# Patient Record
Sex: Male | Born: 1960 | Race: Black or African American | Hispanic: No | Marital: Married | Smoking: Former smoker
Health system: Southern US, Community
[De-identification: ages and names within clinical notes are randomized; demographics above are authoritative.]

## PROBLEM LIST (undated history)

## (undated) DIAGNOSIS — K317 Polyp of stomach and duodenum: Secondary | ICD-10-CM

## (undated) DIAGNOSIS — I10 Essential (primary) hypertension: Secondary | ICD-10-CM

## (undated) HISTORY — DX: Essential (primary) hypertension: I10

## (undated) HISTORY — DX: Polyp of stomach and duodenum: K31.7

---

## 2011-02-14 ENCOUNTER — Ambulatory Visit: Payer: Self-pay | Admitting: Family Medicine

## 2011-02-21 ENCOUNTER — Encounter: Payer: Self-pay | Admitting: Family Medicine

## 2011-02-21 ENCOUNTER — Ambulatory Visit (INDEPENDENT_AMBULATORY_CARE_PROVIDER_SITE_OTHER): Payer: 59 | Admitting: Family Medicine

## 2011-02-21 DIAGNOSIS — Z125 Encounter for screening for malignant neoplasm of prostate: Secondary | ICD-10-CM

## 2011-02-21 DIAGNOSIS — I10 Essential (primary) hypertension: Secondary | ICD-10-CM | POA: Insufficient documentation

## 2011-02-21 DIAGNOSIS — Z13220 Encounter for screening for lipoid disorders: Secondary | ICD-10-CM

## 2011-02-21 MED ORDER — TRIAMTERENE-HCTZ 37.5-25 MG PO TABS: 1.0000 | ORAL_TABLET | Freq: Every day | ORAL | Status: DC

## 2011-02-21 NOTE — Progress Notes (Signed)
  Subjective:    Patient ID: Joel Johnston, male    DOB: 1960-12-31, 50 y.o.   MRN: [id number]  HPI 50 yo AAM presents for NOV.  He is previously healthy.  He has some seasonal allergies. He has not been to the doctor or had labs in years.  Denies hx of heart problems or a fam hx of HTN.  Denies change in vision, edema, HAs, or use of decongestants.  Denies CP or DOE.     BP 174/112  Pulse 81  Temp(Src) 98.7 F (37.1 C) (Oral)  Ht 5' 7.25" (1.708 m)  Wt 181 lb (82.101 kg)  BMI 28.14 kg/m2  SpO2 95%  Past Medical History  Diagnosis Date  . Hypertension     History reviewed. No pertinent past surgical history.  Family History  Problem Relation Age of Onset  . Cancer Mother     History   Social History  . Marital Status: Married    Spouse Name: Okey Regal    Number of Children: 0  . 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 Smoking status: Former Smoker    Quit date: 10/28/1983  . Smokeless tobacco: Never Used  . Alcohol Use: 1.2 oz/week    2 Cans of beer per week  . Drug Use: No  . Sexually Active: Yes   Other Topics Concern  . Not on file   Social History Narrative  . No narrative on file    No Known Allergies  Current outpatient prescriptions:triamterene-hydrochlorothiazide (MAXZIDE-25) 37.5-25 MG per tablet, Take 1 tablet by mouth daily., Disp: 30 tablet, Rfl: 3   Review of Systems  Constitutional: Negative for fatigue.  Eyes: Negative for visual disturbance.  Respiratory: Negative for cough and shortness of breath.   Cardiovascular: Negative for chest pain, palpitations and leg swelling.  Genitourinary: Negative for difficulty urinating.  Musculoskeletal: Negative for back pain.  Neurological: Negative for headaches.  Psychiatric/Behavioral: Negative for dysphoric mood.       Objective:   Physical Exam  Constitutional: He appears well-developed and well-nourished. No  distress.  HENT:  Head: Normocephalic and atraumatic.  Eyes: Pupils are equal, round, and reactive to light.  Neck: No thyromegaly present.  Cardiovascular: Normal rate, regular rhythm and normal heart sounds.   No murmur heard. Pulmonary/Chest: Effort normal and breath sounds normal. No respiratory distress.  Musculoskeletal: He exhibits no edema.  Lymphadenopathy:    He has no cervical adenopathy.  Skin: Skin is warm and dry.  Psychiatric: He has a normal mood and affect.          Assessment & Plan:

## 2011-02-21 NOTE — Patient Instructions (Addendum)

## 2011-02-21 NOTE — Assessment & Plan Note (Signed)
BP very high today w/o use of cold meds.  Will go ahead and start him on Maxzide, esp given his DBP > 100.  Update fasting labs and set up CPE with EKG in 4 wks.  Call if any problems.  Work on low sodium diet.

## 2011-02-28 LAB — CBC
HCT: 52.7 % — ABNORMAL HIGH (ref 39.0–52.0)
Hemoglobin: 17.9 g/dL — ABNORMAL HIGH (ref 13.0–17.0)
MCH: 29.9 pg (ref 26.0–34.0)
MCHC: 34 g/dL (ref 30.0–36.0)
MCV: 88 fL (ref 78.0–100.0)
Platelets: 221 10*3/uL (ref 150–400)
RBC: 5.99 MIL/uL — ABNORMAL HIGH (ref 4.22–5.81)
RDW: 14.4 % (ref 11.5–15.5)
WBC: 6 10*3/uL (ref 4.0–10.5)

## 2011-02-28 LAB — LIPID PANEL
Cholesterol: 136 mg/dL (ref 0–200)
HDL: 40 mg/dL (ref >39)
LDL Cholesterol: 84 mg/dL (ref 0–99)
Total CHOL/HDL Ratio: 3.4 Ratio
Triglycerides: 60 mg/dL (ref <150)
VLDL: 12 mg/dL (ref 0–40)

## 2011-02-28 LAB — PSA: PSA: 1.05 ng/mL (ref <=4.00)

## 2011-03-01 ENCOUNTER — Telehealth: Payer: Self-pay | Admitting: Family Medicine

## 2011-03-01 LAB — COMPLETE METABOLIC PANEL WITH GFR
ALT: 21 U/L (ref 0–53)
AST: 20 U/L (ref 0–37)
Albumin: 4.5 g/dL (ref 3.5–5.2)
Alkaline Phosphatase: 94 U/L (ref 39–117)
BUN: 14 mg/dL (ref 6–23)
CO2: 26 mEq/L (ref 19–32)
Calcium: 9.5 mg/dL (ref 8.4–10.5)
Chloride: 103 mEq/L (ref 96–112)
Creat: 1.15 mg/dL (ref 0.40–1.50)
GFR, Est African American: >60 mL/min (ref >60)
GFR, Est Non African American: >60 mL/min (ref >60)
Glucose, Bld: 96 mg/dL (ref 70–99)
Potassium: 4.7 mEq/L (ref 3.5–5.3)
Sodium: 141 mEq/L (ref 135–145)
Total Bilirubin: 0.4 mg/dL (ref 0.3–1.2)
Total Protein: 6.7 g/dL (ref 6.0–8.3)

## 2011-03-01 NOTE — Telephone Encounter (Signed)
Pls let pt know that his blood counts came back normal.  Fasting sugar, liver and and kidney function came back normal.  Cholesterol is at goal and prostate cancer screen is normal.  Repeat in 1 yr.

## 2011-03-03 NOTE — Telephone Encounter (Signed)
Pt aware of the below

## 2011-04-04 ENCOUNTER — Encounter: Payer: Self-pay | Admitting: Family Medicine

## 2011-04-04 ENCOUNTER — Ambulatory Visit (INDEPENDENT_AMBULATORY_CARE_PROVIDER_SITE_OTHER): Payer: 59 | Admitting: Family Medicine

## 2011-04-04 VITALS — BP 144/95 | HR 90 | Ht 67.0 in | Wt 184.0 lb

## 2011-04-04 DIAGNOSIS — I10 Essential (primary) hypertension: Secondary | ICD-10-CM

## 2011-04-04 MED ORDER — TRIAMTERENE-HCTZ 37.5-25 MG PO TABS: 1.0000 | ORAL_TABLET | Freq: Every day | ORAL | Status: DC

## 2011-04-04 NOTE — Progress Notes (Signed)
  Subjective:    Patient ID: Joel Johnston, male    DOB: July 24, 1961, 50 y.o.   MRN: [id number]  HPI  50 yo AAF presents for f/u HTN visit.  He was started on Maxzide 6 wks ago but ran out 2 wks ago.  Denies chest pain or DOE.  He did not RF his RX.  He was doing great on it.  Had labs done last visit.    BP 144/95  Pulse 90  Ht 5\' 7"  (1.702 m)  Wt 184 lb (83.462 kg)  BMI 28.82 kg/m2  SpO2 98%    Review of Systems  Constitutional: Negative for fatigue.  Eyes: Negative for visual disturbance.  Respiratory: Negative for shortness of breath.   Cardiovascular: Negative for chest pain, palpitations and leg swelling.  Neurological: Negative for headaches.       Objective:   Physical Exam  Constitutional: He appears well-developed and well-nourished.  HENT:  Mouth/Throat: Oropharynx is clear and moist.  Neck: Neck supple. No thyromegaly present.  Cardiovascular: Normal rate, regular rhythm and normal heart sounds.   Pulmonary/Chest: Effort normal and breath sounds normal. No respiratory distress. He has no wheezes.  Musculoskeletal: He exhibits no edema.  Skin: Skin is warm and dry.  Psychiatric: He has a normal mood and affect.          Assessment & Plan:

## 2011-04-04 NOTE — Assessment & Plan Note (Signed)
BP high today because he ran out of Maxzide 2 wks ago.  Will RF his Maxzide and f/u in a month for a PHYSICAL with EKG and to recheck BMP next visit.

## 2011-04-04 NOTE — Patient Instructions (Signed)
Restart MAXZIDE everyday for high Blood pressure.  Refilled today.  Return for a PHYSICAL, repeat BMP/ EKG in 1 month.

## 2011-05-02 ENCOUNTER — Encounter: Payer: Self-pay | Admitting: Family Medicine

## 2011-05-02 ENCOUNTER — Ambulatory Visit (INDEPENDENT_AMBULATORY_CARE_PROVIDER_SITE_OTHER): Payer: Self-pay | Admitting: Family Medicine

## 2011-05-02 VITALS — BP 158/97 | HR 87 | Ht 67.0 in | Wt 185.0 lb

## 2011-05-02 DIAGNOSIS — Z23 Encounter for immunization: Secondary | ICD-10-CM

## 2011-05-02 DIAGNOSIS — Z0000 Encounter for general adult medical examination without abnormal findings: Secondary | ICD-10-CM

## 2011-05-02 DIAGNOSIS — Z1211 Encounter for screening for malignant neoplasm of colon: Secondary | ICD-10-CM

## 2011-05-02 DIAGNOSIS — I10 Essential (primary) hypertension: Secondary | ICD-10-CM

## 2011-05-02 MED ORDER — TETANUS-DIPHTH-ACELL PERTUSSIS 5-2.5-18.5 LF-MCG/0.5 IM SUSP: 0.5000 mL | Freq: Once | INTRAMUSCULAR | Status: DC

## 2011-05-02 MED ORDER — TELMISARTAN-HCTZ 80-12.5 MG PO TABS: 1.0000 | ORAL_TABLET | Freq: Every day | ORAL | Status: DC

## 2011-05-02 NOTE — Patient Instructions (Signed)
Change Maxzide to Micardis/ HCTZ once daily for high BP.  EKG looks good.  Repeat chemistry panel today. Will call you w/ results on Monday.  Return for f/u BP in 3 mos.

## 2011-05-02 NOTE — Progress Notes (Signed)
  Subjective:    Patient ID: Joel Johnston, male    DOB: [birth year], 50 y.o.   MRN: [id number]  HPI  50 yo AAM presents for CPE.  He is back on the Maxzide but his BP is still high.  He had labs done this year but is due to have BMP rechecked.  He no longer smokes.  Denies any chest pain or SOB.  No fam hx of heart dz.  He has never had a colonoscopy.  He is willing to schedule this.  Denies a fam hx of prostate or colon cancer.    BP 158/97  Pulse 87  Ht 5\' 7"  (1.702 m)  Wt 185 lb (83.915 kg)  BMI 28.97 kg/m2  SpO2 99%     Review of Systems Gen: no fevers, chills, hot flashes, night sweats, change in weight GI: no N/V/C/D GU: no dysuria, incontinence or sexual dysfunction CV: no chest pain, DOE, palpitations s or edema Pulm:  Denies CP, SOB or chronic cough     Objective:   Physical Exam  Genitourinary: Rectum normal and prostate normal. Guaiac negative stool.   Gen: alert, well groomed in NAD Neck: no thyromegaly or cervical lymphadenopathy CV: RRR w/o murmur, no audible carotid bruits or abdominal aortic bruits Ext: no edema, clubbing or cyanosis Lungs: CTA bilat w/o W/R/R; nonlabored HEENT:  [place]/AT; PERRLA; oropharynx pink and moist with good dentition Abd: soft, NT, ND, NABS, No HSM, no audible AA bruits Skin: warm and dry; no rash, pallor or jaundice Psych: does not appear anxious or depressed; answers questions appropriately        Assessment & Plan:  Assesment:  1. CPE- Keeping healthy checklist for men reviewed today.  BP still high on Maxzide.  Will change him to Micardis/ HCTZ 80/12.5 mg daily.  BMI 28  in the overwt range.     Labs UTD, asside from f/u BMP. Colonoscopy due -- will set up. PSA UTD, DRE done today. Encouraged healthy diet, regular exercise, MVI daily. Return for next physical in 1 yr.  Return for f/u BP in 2-3 mos. Tdap updated today. EKG: NSR at 76 bpm, PR 144 ms, QTc 402 ms, left axis deviation.  No sign of ischemia.

## 2011-05-03 LAB — BASIC METABOLIC PANEL WITH GFR
BUN: 11 mg/dL (ref 6–23)
CO2: 20 mEq/L (ref 19–32)
Calcium: 9.6 mg/dL (ref 8.4–10.5)
Chloride: 107 mEq/L (ref 96–112)
Creat: 0.99 mg/dL (ref 0.50–1.35)
GFR, Est African American: >60 mL/min (ref >60)
GFR, Est Non African American: >60 mL/min (ref >60)
Glucose, Bld: 105 mg/dL — ABNORMAL HIGH (ref 70–99)
Potassium: 4.2 mEq/L (ref 3.5–5.3)
Sodium: 141 mEq/L (ref 135–145)

## 2011-05-04 ENCOUNTER — Telehealth: Payer: Self-pay | Admitting: Family Medicine

## 2011-05-04 NOTE — Telephone Encounter (Signed)
pls let pt know that his chemistry panel came back normal.

## 2011-05-05 NOTE — Telephone Encounter (Signed)
Pt aware of the above  

## 2011-05-14 ENCOUNTER — Encounter: Payer: Self-pay | Admitting: Family Medicine

## 2011-08-01 ENCOUNTER — Ambulatory Visit: Payer: Self-pay | Admitting: Family Medicine

## 2014-02-24 ENCOUNTER — Encounter: Payer: Self-pay | Admitting: Physician Assistant

## 2014-02-24 ENCOUNTER — Ambulatory Visit (INDEPENDENT_AMBULATORY_CARE_PROVIDER_SITE_OTHER): Payer: BC Managed Care – PPO | Admitting: Physician Assistant

## 2014-02-24 VITALS — BP 177/103 | HR 82 | Ht 67.0 in | Wt 170.0 lb

## 2014-02-24 DIAGNOSIS — Z131 Encounter for screening for diabetes mellitus: Secondary | ICD-10-CM

## 2014-02-24 DIAGNOSIS — Z1211 Encounter for screening for malignant neoplasm of colon: Secondary | ICD-10-CM

## 2014-02-24 DIAGNOSIS — I10 Essential (primary) hypertension: Secondary | ICD-10-CM

## 2014-02-24 DIAGNOSIS — Z13220 Encounter for screening for lipoid disorders: Secondary | ICD-10-CM

## 2014-02-24 MED ORDER — TELMISARTAN-HCTZ 80-12.5 MG PO TABS: 1.0000 | ORAL_TABLET | Freq: Every day | ORAL | Status: DC

## 2014-02-24 NOTE — Patient Instructions (Signed)

## 2014-02-24 NOTE — Progress Notes (Signed)
   Subjective:    Patient ID: Joel Johnston, male    DOB: 1960/11/10, 53 y.o.   MRN: [id number]  HPI Pt is a 53 yo male who presents to the clinic to re-establish care. Pt has hx of HTN and has been off medication for over one year. Denies any HA, vision changes, CP or palpitations. Exercises daily. Pt went for job physical and they sent him to urgent care because BP was so high. He tolerated micardis/hCt in the past.   . Active Ambulatory Problems    Diagnosis Date Noted  . Essential hypertension, benign 02/21/2011   Resolved Ambulatory Problems    Diagnosis Date Noted  . No Resolved Ambulatory Problems   Past Medical History  Diagnosis Date  . Hypertension    . Family History  Problem Relation Age of Onset  . Cancer Mother    . History   Social History  . Marital Status: Married    Spouse Name: Okey RegalCarol    Number of Children: 0  . 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 Smoking status: Former Smoker    Quit date: 10/28/1983  . Smokeless tobacco: Never Used  . Alcohol Use: 1.2 oz/week    2 Cans of beer per week  . Drug Use: No  . Sexual Activity: Yes   Other Topics Concern  . Not on file   Social History Narrative  . No narrative on file     Review of Systems  All other systems reviewed and are negative.      Objective:   Physical Exam  Constitutional: He is oriented to person, place, and time. He appears well-developed and well-nourished.  HENT:  Head: Normocephalic and atraumatic.  Cardiovascular: Normal rate, regular rhythm and normal heart sounds.   Pulmonary/Chest: Effort normal and breath sounds normal.  Neurological: He is alert and oriented to person, place, and time.  Psychiatric: He has a normal mood and affect. His behavior is normal.          Assessment & Plan:  HTN- restarted micardis/HCT since worked in the past. Follow up in 4 weeks for BP recheck.  Reminded of low salt diet.   Needs lipid and kidney function checked. Gave labslip and reminded to be fasting.   Needs colonoscopy sent referral.   Reminded of CPE need.

## 2014-02-28 LAB — COMPLETE METABOLIC PANEL WITH GFR
ALT: 14 U/L (ref 0–53)
AST: 16 U/L (ref 0–37)
Albumin: 4.3 g/dL (ref 3.5–5.2)
Alkaline Phosphatase: 91 U/L (ref 39–117)
BUN: 13 mg/dL (ref 6–23)
CO2: 31 mEq/L (ref 19–32)
Calcium: 9.4 mg/dL (ref 8.4–10.5)
Chloride: 104 mEq/L (ref 96–112)
Creat: 0.92 mg/dL (ref 0.50–1.35)
GFR, Est African American: >89 mL/min
GFR, Est Non African American: >89 mL/min
Glucose, Bld: 88 mg/dL (ref 70–99)
Potassium: 3.9 mEq/L (ref 3.5–5.3)
Sodium: 140 mEq/L (ref 135–145)
Total Bilirubin: 0.9 mg/dL (ref 0.2–1.2)
Total Protein: 6.8 g/dL (ref 6.0–8.3)

## 2014-02-28 LAB — LIPID PANEL
Cholesterol: 143 mg/dL (ref 0–200)
HDL: 45 mg/dL (ref >39)
LDL Cholesterol: 85 mg/dL (ref 0–99)
Total CHOL/HDL Ratio: 3.2 Ratio
Triglycerides: 66 mg/dL (ref <150)
VLDL: 13 mg/dL (ref 0–40)

## 2014-03-24 ENCOUNTER — Encounter: Payer: Self-pay | Admitting: Physician Assistant

## 2014-03-24 ENCOUNTER — Ambulatory Visit (INDEPENDENT_AMBULATORY_CARE_PROVIDER_SITE_OTHER): Payer: BC Managed Care – PPO | Admitting: Physician Assistant

## 2014-03-24 ENCOUNTER — Ambulatory Visit: Payer: BC Managed Care – PPO | Admitting: Physician Assistant

## 2014-03-24 VITALS — BP 136/86 | HR 95 | Ht 67.0 in | Wt 167.0 lb

## 2014-03-24 DIAGNOSIS — I10 Essential (primary) hypertension: Secondary | ICD-10-CM

## 2014-03-24 MED ORDER — TELMISARTAN-HCTZ 80-12.5 MG PO TABS: 1.0000 | ORAL_TABLET | Freq: Every day | ORAL | Status: DC

## 2014-03-24 NOTE — Patient Instructions (Signed)
[facility] referral sent for colonoscopy.

## 2014-03-24 NOTE — Progress Notes (Signed)
   Subjective:    Patient ID: Joel Johnston, male    DOB: Apr 09, 1961, 53 y.o.   MRN: [id number]  HPI Patient is a 53 year old male who presents to the clinic to followup on hypertension. He has started micarditis/HCT and doing well. He denies any chest pain, palpitations, headaches, vision changes. He is trying to get his blood pressure under control to be able to do the fitness test for his job.   Review of Systems  All other systems reviewed and are negative.      Objective:   Physical Exam  Constitutional: He is oriented to person, place, and time. He appears well-developed and well-nourished.  HENT:  Head: Normocephalic and atraumatic.  Cardiovascular: Normal rate, regular rhythm and normal heart sounds.   Pulmonary/Chest: Effort normal and breath sounds normal.  Neurological: He is alert and oriented to person, place, and time.  Skin: Skin is dry.  Psychiatric: He has a normal mood and affect. His behavior is normal.          Assessment & Plan:  Hypertension-refilled micardis for 6 months. We'll recheck in 6 months. All labs look great. Sent letter to work the patient is released for physical fitness testing. Blood pressure is under 140/90 today.  Patient has not called for colonoscopy. Discussed with patient that in system referral has been made. He should call [facility] to make colonoscopy appointment.

## 2014-03-27 ENCOUNTER — Telehealth: Payer: Self-pay | Admitting: *Deleted

## 2014-03-27 NOTE — Telephone Encounter (Signed)
Ok will add norvasc 5 mg qd to regimen. I do not want to drop too low. Start and see if keeps BP down. Please send to [facility].

## 2014-03-27 NOTE — Telephone Encounter (Signed)
Pt called today & said that he went for a stress test today for a job & his bp was too high.  He couldn't remember what it was but was 150 something on the top.  They told him to contact you about possibly increasing his medication to get it under 140/90.  I told him his pressure was good when he was in the office last.

## 2014-03-28 ENCOUNTER — Other Ambulatory Visit: Payer: Self-pay | Admitting: *Deleted

## 2014-03-28 MED ORDER — AMLODIPINE BESYLATE 5 MG PO TABS: 5.0000 mg | ORAL_TABLET | Freq: Every day | ORAL | Status: DC

## 2014-03-28 NOTE — Telephone Encounter (Signed)
Pt notified rx sent to pharm

## 2014-05-03 ENCOUNTER — Other Ambulatory Visit: Payer: Self-pay

## 2014-05-03 MED ORDER — TELMISARTAN-HCTZ 80-12.5 MG PO TABS: 1.0000 | ORAL_TABLET | Freq: Every day | ORAL | Status: DC

## 2014-05-22 ENCOUNTER — Other Ambulatory Visit: Payer: Self-pay | Admitting: *Deleted

## 2014-05-22 MED ORDER — TELMISARTAN-HCTZ 80-12.5 MG PO TABS: 1.0000 | ORAL_TABLET | Freq: Every day | ORAL | Status: DC

## 2014-06-26 ENCOUNTER — Other Ambulatory Visit: Payer: Self-pay | Admitting: Physician Assistant

## 2014-06-26 ENCOUNTER — Telehealth: Payer: Self-pay | Admitting: Physician Assistant

## 2014-06-26 MED ORDER — TELMISARTAN-HCTZ 80-12.5 MG PO TABS: 1.0000 | ORAL_TABLET | Freq: Every day | ORAL | Status: DC

## 2014-06-26 MED ORDER — AMLODIPINE BESYLATE 5 MG PO TABS: 5.0000 mg | ORAL_TABLET | Freq: Every day | ORAL | Status: DC

## 2014-06-26 NOTE — Telephone Encounter (Signed)
Patient is requesting 90 day supply of the hydrochlorothiazid sent to [facility].

## 2014-06-26 NOTE — Telephone Encounter (Signed)
[facility] both bp's meds for [facility]

## 2014-09-25 ENCOUNTER — Ambulatory Visit: Payer: BC Managed Care – PPO | Admitting: Physician Assistant

## 2014-09-29 ENCOUNTER — Encounter: Payer: Self-pay | Admitting: Physician Assistant

## 2014-09-29 ENCOUNTER — Ambulatory Visit (INDEPENDENT_AMBULATORY_CARE_PROVIDER_SITE_OTHER): Payer: BC Managed Care – PPO | Admitting: Physician Assistant

## 2014-09-29 VITALS — BP 136/88 | HR 90 | Ht 67.0 in | Wt 172.0 lb

## 2014-09-29 DIAGNOSIS — I10 Essential (primary) hypertension: Secondary | ICD-10-CM | POA: Diagnosis not present

## 2014-09-29 DIAGNOSIS — Z1211 Encounter for screening for malignant neoplasm of colon: Secondary | ICD-10-CM

## 2014-09-29 MED ORDER — TELMISARTAN-HCTZ 80-12.5 MG PO TABS: 1.0000 | ORAL_TABLET | Freq: Every day | ORAL | Status: DC

## 2014-09-29 NOTE — Progress Notes (Signed)
   Subjective:    Patient ID: Joel Johnston, male    DOB: 04-27-1961, 53 y.o.   MRN: [id number]  HPI  Pt presents to the clinic for 6 month medication refill and HTN.   HTN- doing well. No CP, palpitations, headaches, vision changes. Taking micardis daily. No concerns.   Never called for colonoscopy.   Review of Systems  All other systems reviewed and are negative.      Objective:   Physical Exam  Constitutional: He is oriented to person, place, and time. He appears well-developed and well-nourished.  HENT:  Head: Normocephalic and atraumatic.  Cardiovascular: Normal rate, regular rhythm and normal heart sounds.   Pulmonary/Chest: Effort normal and breath sounds normal.  Neurological: He is alert and oriented to person, place, and time.  Skin: Skin is dry.  Psychiatric: He has a normal mood and affect. His behavior is normal.          Assessment & Plan:  HTN- refilled for one year. Discussed needed CPE every year and to follow up then. CMP 7 months ago great.   Pt declined flu shot.   Referral made for colonoscopy again. Discussed to call if not heard back in 2 weeks.

## 2016-07-14 ENCOUNTER — Ambulatory Visit (INDEPENDENT_AMBULATORY_CARE_PROVIDER_SITE_OTHER): Payer: Self-pay | Admitting: Physician Assistant

## 2016-07-14 ENCOUNTER — Encounter: Payer: Self-pay | Admitting: Physician Assistant

## 2016-07-14 VITALS — BP 196/107 | HR 81 | Ht 67.0 in | Wt 171.0 lb

## 2016-07-14 DIAGNOSIS — Z131 Encounter for screening for diabetes mellitus: Secondary | ICD-10-CM

## 2016-07-14 DIAGNOSIS — I160 Hypertensive urgency: Secondary | ICD-10-CM | POA: Insufficient documentation

## 2016-07-14 DIAGNOSIS — Z13220 Encounter for screening for lipoid disorders: Secondary | ICD-10-CM

## 2016-07-14 DIAGNOSIS — I10 Essential (primary) hypertension: Secondary | ICD-10-CM

## 2016-07-14 MED ORDER — TELMISARTAN-HCTZ 80-12.5 MG PO TABS: 1.0000 | ORAL_TABLET | Freq: Every day | ORAL | 1 refills | Status: DC

## 2016-07-14 NOTE — Patient Instructions (Signed)
DASH Eating Plan  DASH stands for "Dietary Approaches to Stop Hypertension." The DASH eating plan is a healthy eating plan that has been shown to reduce high blood pressure (hypertension). Additional health benefits may include reducing the risk of type 2 diabetes mellitus, heart disease, and stroke. The DASH eating plan may also help with weight loss.  WHAT DO I NEED TO KNOW ABOUT THE DASH EATING PLAN?  For the DASH eating plan, you will follow these general guidelines:  · Choose foods with a percent daily value for sodium of less than 5% (as listed on the food label).  · Use salt-free seasonings or herbs instead of table salt or sea salt.  · Check with your health care provider or pharmacist before using salt substitutes.  · Eat lower-sodium products, often labeled as "lower sodium" or "no salt added."  · Eat fresh foods.  · Eat more vegetables, fruits, and low-fat dairy products.  · Choose whole grains. Look for the word "whole" as the first word in the ingredient list.  · Choose fish and skinless chicken or turkey more often than red meat. Limit fish, poultry, and meat to 6 oz (170 g) each day.  · Limit sweets, desserts, sugars, and sugary drinks.  · Choose heart-healthy fats.  · Limit cheese to 1 oz (28 g) per day.  · Eat more home-cooked food and less restaurant, buffet, and fast food.  · Limit fried foods.  · Cook foods using methods other than frying.  · Limit canned vegetables. If you do use them, rinse them well to decrease the sodium.  · When eating at a restaurant, ask that your food be prepared with less salt, or no salt if possible.  WHAT FOODS CAN I EAT?  Seek help from a dietitian for individual calorie needs.  Grains  Whole grain or whole wheat bread. Brown rice. Whole grain or whole wheat pasta. Quinoa, bulgur, and whole grain cereals. Low-sodium cereals. Corn or whole wheat flour tortillas. Whole grain cornbread. Whole grain crackers. Low-sodium crackers.  Vegetables  Fresh or frozen vegetables  (raw, steamed, roasted, or grilled). Low-sodium or reduced-sodium tomato and vegetable juices. Low-sodium or reduced-sodium tomato sauce and paste. Low-sodium or reduced-sodium canned vegetables.   Fruits  All fresh, canned (in natural juice), or frozen fruits.  Meat and Other Protein Products  Ground beef (85% or leaner), grass-fed beef, or beef trimmed of fat. Skinless chicken or turkey. Ground chicken or turkey. Pork trimmed of fat. All fish and seafood. Eggs. Dried beans, peas, or lentils. Unsalted nuts and seeds. Unsalted canned beans.  Dairy  Low-fat dairy products, such as skim or 1% milk, 2% or reduced-fat cheeses, low-fat ricotta or cottage cheese, or plain low-fat yogurt. Low-sodium or reduced-sodium cheeses.  Fats and Oils  Tub margarines without trans fats. Light or reduced-fat mayonnaise and salad dressings (reduced sodium). Avocado. Safflower, olive, or canola oils. Natural peanut or almond butter.  Other  Unsalted popcorn and pretzels.  The items listed above may not be a complete list of recommended foods or beverages. Contact your dietitian for more options.  WHAT FOODS ARE NOT RECOMMENDED?  Grains  White bread. White pasta. White rice. Refined cornbread. Bagels and croissants. Crackers that contain trans fat.  Vegetables  Creamed or fried vegetables. Vegetables in a cheese sauce. Regular canned vegetables. Regular canned tomato sauce and paste. Regular tomato and vegetable juices.  Fruits  Dried fruits. Canned fruit in light or heavy syrup. Fruit juice.  Meat and Other Protein   Products  Fatty cuts of meat. Ribs, chicken wings, bacon, sausage, bologna, salami, chitterlings, fatback, hot dogs, bratwurst, and packaged luncheon meats. Salted nuts and seeds. Canned beans with salt.  Dairy  Whole or 2% milk, cream, half-and-half, and cream cheese. Whole-fat or sweetened yogurt. Full-fat cheeses or blue cheese. Nondairy creamers and whipped toppings. Processed cheese, cheese spreads, or cheese  curds.  Condiments  Onion and garlic salt, seasoned salt, table salt, and sea salt. Canned and packaged gravies. Worcestershire sauce. Tartar sauce. Barbecue sauce. Teriyaki sauce. Soy sauce, including reduced sodium. Steak sauce. Fish sauce. Oyster sauce. Cocktail sauce. Horseradish. Ketchup and mustard. Meat flavorings and tenderizers. Bouillon cubes. Hot sauce. Tabasco sauce. Marinades. Taco seasonings. Relishes.  Fats and Oils  Butter, stick margarine, lard, shortening, ghee, and bacon fat. Coconut, palm kernel, or palm oils. Regular salad dressings.  Other  Pickles and olives. Salted popcorn and pretzels.  The items listed above may not be a complete list of foods and beverages to avoid. Contact your dietitian for more information.  WHERE CAN I FIND MORE INFORMATION?  National Heart, Lung, and Blood Institute: www.nhlbi.nih.gov/health/health-topics/topics/dash/     This information is not intended to replace advice given to you by your health care provider. Make sure you discuss any questions you have with your health care provider.     Document Released: 10/02/2011 Document Revised: 11/03/2014 Document Reviewed: 08/17/2013  Elsevier Interactive Patient Education ©2016 [employer] Inc[employer]

## 2016-07-14 NOTE — Progress Notes (Addendum)
Subjective:     Patient ID: Joel Johnston, male   DOB: 1961/04/07, 55 y.o.   MRN: [id number]  HPI Patient is a 55 y.o. African-american male presenting today with concerns of an increased blood pressure. Patient reports that he went to the dentist last week and was told that his systolic blood pressure was 189 mm Hg. Patient states that he was previously taking MICARDIS HCT and felt well controlled but ran out of pills in early spring. Patient notes that he has been having increase headaches and stress with work. He states that he currently works two jobs from [employer]am to 10pm in addition to taking care of his wife who has been ill. The patient reports that he has not been eating much and is not always hungry. The patient notes that he recently saw an eye doctor and had no abnormalities mentioned to him. Patient denies chest pain, palpitations, shortness of breath, light-headedness, or syncope.  Review of Systems  Constitutional: Positive for appetite change. Negative for activity change, chills, diaphoresis, fatigue, fever and unexpected weight change.  HENT: Negative for congestion, ear discharge, ear pain, hearing loss, sinus pressure, sneezing and sore throat.   Eyes: Negative for pain, discharge, redness, itching and visual disturbance.  Respiratory: Negative for cough, chest tightness, shortness of breath and wheezing.   Cardiovascular: Negative for chest pain, palpitations and leg swelling.  Gastrointestinal: Negative for abdominal distention, abdominal pain, blood in stool, constipation, diarrhea, nausea and vomiting.  Genitourinary: Negative for decreased urine volume, difficulty urinating, dysuria, frequency, hematuria and urgency.  Musculoskeletal: Negative.   Neurological: Positive for headaches. Negative for dizziness, syncope, speech difficulty, weakness, light-headedness and numbness.  Psychiatric/Behavioral: The patient is nervous/anxious.       Objective:   Physical Exam   Constitutional: He is oriented to person, place, and time. He appears well-developed and well-nourished. No distress.  HENT:  Head: Normocephalic and atraumatic.  Right Ear: External ear normal.  Left Ear: External ear normal.  Nose: Nose normal.  Mouth/Throat: Oropharynx is clear and moist. No oropharyngeal exudate.  Eyes: Conjunctivae and EOM are normal. Pupils are equal, round, and reactive to light. Right eye exhibits no discharge. Left eye exhibits no discharge. No scleral icterus.  Neck: Normal range of motion. Neck supple. No JVD present. No tracheal deviation present. No thyromegaly present.  Cardiovascular: Normal rate, regular rhythm, normal heart sounds and intact distal pulses.  Exam reveals no gallop and no friction rub.   No murmur heard. Pulmonary/Chest: Effort normal and breath sounds normal. No stridor. No respiratory distress. He has no wheezes. He has no rales. He exhibits no tenderness.  Abdominal: Soft. Bowel sounds are normal. He exhibits no distension and no mass. There is no tenderness. There is no rebound and no guarding.  Lymphadenopathy:    He has no cervical adenopathy.  Neurological: He is alert and oriented to person, place, and time. He displays normal reflexes. No cranial nerve deficit. He exhibits normal muscle tone. Coordination normal.  Skin: Skin is warm and dry. No rash noted. He is not diaphoretic. No erythema. No pallor.  Psychiatric: He has a normal mood and affect. His behavior is normal. Judgment and thought content normal.      Assessment:     Joel Johnston was seen today for hypertension.  Diagnoses and all orders for this visit:  Asymptomatic hypertensive urgency  Screening for lipid disorders -     Lipid panel  Screening for diabetes mellitus -     COMPLETE METABOLIC PANEL  WITH GFR  Other orders -     telmisartan-hydrochlorothiazide (MICARDIS HCT) 80-12.5 MG tablet; Take 1 tablet by mouth daily.      Plan:     1. Asymptomatic  hypertensive urgency - Patient with initial blood pressure reading of 196/107 and later reading of 188/100 mmHg. Patient reports no symptoms at this time. Patient instructed to initiate treatment with Micardis HCT 80-12.5 mg tablets daily. Patient to get CMP and lipid panel to assess kidney function and cardiovascular risk. Patient educated on the importance of maintaining a strict dietary regimen that is low in sodium such as the DASH diet. Patient to return-to-clinic in 1 month for routine management will obtain baseline EKG at this time. He was previously on norvasc with micardis likely will have to add this.   2. Screening for lipid/diabetes - Discussed with patient the importance of monitoring his kidney function and cholesterol secondary to increased blood pressure. Will assess need for further medical intervention upon receiving results. Will continue to monitor.  Summary - Patient to return-to-clinic in 1 month for blood pressure check and baseline EKG. Patient to seek immediate medical care if he experiences chest pain, headache, numbness, blurry vision, palpitations, or shortness of breath.

## 2016-08-04 DIAGNOSIS — Z13220 Encounter for screening for lipoid disorders: Secondary | ICD-10-CM | POA: Diagnosis not present

## 2016-08-04 DIAGNOSIS — Z131 Encounter for screening for diabetes mellitus: Secondary | ICD-10-CM | POA: Diagnosis not present

## 2016-08-05 LAB — COMPLETE METABOLIC PANEL WITH GFR
ALT: 17 U/L (ref 9–46)
AST: 19 U/L (ref 10–35)
Albumin: 4.1 g/dL (ref 3.6–5.1)
Alkaline Phosphatase: 100 U/L (ref 40–115)
BUN: 13 mg/dL (ref 7–25)
CO2: 26 mmol/L (ref 20–31)
Calcium: 9.4 mg/dL (ref 8.6–10.3)
Chloride: 103 mmol/L (ref 98–110)
Creat: 1.04 mg/dL (ref 0.70–1.33)
GFR, Est African American: >89 mL/min (ref >=60)
GFR, Est Non African American: 80 mL/min (ref >=60)
Glucose, Bld: 88 mg/dL (ref 65–99)
Potassium: 4.3 mmol/L (ref 3.5–5.3)
Sodium: 141 mmol/L (ref 135–146)
Total Bilirubin: 1 mg/dL (ref 0.2–1.2)
Total Protein: 7.1 g/dL (ref 6.1–8.1)

## 2016-08-05 LAB — LIPID PANEL
Cholesterol: 139 mg/dL (ref 125–200)
HDL: 42 mg/dL (ref >=40)
LDL Cholesterol: 82 mg/dL (ref <130)
Total CHOL/HDL Ratio: 3.3 Ratio (ref <=5.0)
Triglycerides: 77 mg/dL (ref <150)
VLDL: 15 mg/dL (ref <30)

## 2016-09-01 ENCOUNTER — Encounter: Payer: Self-pay | Admitting: Physician Assistant

## 2016-09-01 ENCOUNTER — Ambulatory Visit (INDEPENDENT_AMBULATORY_CARE_PROVIDER_SITE_OTHER): Payer: Self-pay | Admitting: Physician Assistant

## 2016-09-01 VITALS — BP 144/85 | HR 92 | Ht 67.0 in | Wt 171.0 lb

## 2016-09-01 DIAGNOSIS — I10 Essential (primary) hypertension: Secondary | ICD-10-CM

## 2016-09-01 MED ORDER — TELMISARTAN-HCTZ 80-25 MG PO TABS: 1.0000 | ORAL_TABLET | Freq: Every day | ORAL | 2 refills | Status: DC

## 2016-09-01 NOTE — Progress Notes (Signed)
   Subjective:    Patient ID: Joel Johnston, male    DOB: 1961/03/27, 55 y.o.   MRN: [id number]  HPI Pt is a 55 yo male who presents to the clinic to follow up on asymptomatic hypertensive urgency. He is taking micardis and doing well. Denies any side effects. No cp, palpitations, headache, SoB, or dizziness. Not checking BP at home.   .. Active Ambulatory Problems    Diagnosis Date Noted  . Essential hypertension, benign 02/21/2011  . Asymptomatic hypertensive urgency 07/14/2016   Resolved Ambulatory Problems    Diagnosis Date Noted  . No Resolved Ambulatory Problems   Past Medical History:  Diagnosis Date  . Hypertension    .Marland Kitchen. Family History  Problem Relation Age of Onset  . Cancer Mother       Review of Systems  All other systems reviewed and are negative.      Objective:   Physical Exam  Constitutional: He is oriented to person, place, and time. He appears well-developed and well-nourished.  HENT:  Head: Normocephalic and atraumatic.  Cardiovascular: Normal rate, regular rhythm and normal heart sounds.   Pulmonary/Chest: Effort normal and breath sounds normal.  Neurological: He is alert and oriented to person, place, and time.  Psychiatric: He has a normal mood and affect. His behavior is normal.          Assessment & Plan:  Marland Kitchen.Marland Kitchen.Joel Johnston was seen today for hypertension.  Diagnoses and all orders for this visit:  Essential hypertension, benign -     telmisartan-hydrochlorothiazide (MICARDIS HCT) 80-25 MG tablet; Take 1 tablet by mouth daily.   Pt is doing great. Almost to goal. Increased to diuretic in the micardis. Follow up in 3 months. Will check bmp then.

## 2016-09-08 ENCOUNTER — Other Ambulatory Visit: Payer: Self-pay | Admitting: Physician Assistant

## 2016-12-11 ENCOUNTER — Other Ambulatory Visit: Payer: Self-pay | Admitting: Physician Assistant

## 2016-12-11 DIAGNOSIS — I10 Essential (primary) hypertension: Secondary | ICD-10-CM

## 2017-01-02 ENCOUNTER — Encounter: Payer: Self-pay | Admitting: Physician Assistant

## 2017-01-02 ENCOUNTER — Ambulatory Visit (INDEPENDENT_AMBULATORY_CARE_PROVIDER_SITE_OTHER): Payer: Self-pay | Admitting: Physician Assistant

## 2017-01-02 DIAGNOSIS — I10 Essential (primary) hypertension: Secondary | ICD-10-CM

## 2017-01-02 MED ORDER — TELMISARTAN-HCTZ 80-25 MG PO TABS: 1.0000 | ORAL_TABLET | Freq: Every day | ORAL | 1 refills | Status: DC

## 2017-01-02 NOTE — Progress Notes (Signed)
   Subjective:    Patient ID: Joel Johnston, male    DOB: December 02, 1960, 56 y.o.   MRN: [id number]  HPI Pt is a 56 yo AA male who presents to the clinic for HTN follow up. He has ran out of medication and comes in for refills. His wife is in the hospital in ICU and not expected to live. He has not had time to come in for appt. Denies any CP, palpitations, headaches, SOB, or dizziness. When he has micardis he takes daily. Not checking BP's.    Review of Systems  All other systems reviewed and are negative.      Objective:   Physical Exam  Constitutional: He is oriented to person, place, and time. He appears well-developed and well-nourished.  HENT:  Head: Normocephalic and atraumatic.  Cardiovascular: Normal rate, regular rhythm and normal heart sounds.   Pulmonary/Chest: Effort normal and breath sounds normal.  Neurological: He is alert and oriented to person, place, and time.  Psychiatric: He has a normal mood and affect. His behavior is normal.          Assessment & Plan:  Joel Johnston.Joel Johnston.[name] was seen today for hypertension.  Diagnoses and all orders for this visit:  Essential hypertension, benign -     telmisartan-hydrochlorothiazide (MICARDIS HCT) 80-25 MG tablet; Take 1 tablet by mouth daily. -     Basic metabolic panel   Refilled medication for 6 months.  Recheck kidney function.  BP not at goal today but not taking medication. Recheck BP in 2 weeks nurse visit. Please occasionally check at pharmacy and report goal.

## 2017-01-03 LAB — BASIC METABOLIC PANEL
BUN: 12 mg/dL (ref 7–25)
CO2: 28 mmol/L (ref 20–31)
Calcium: 9.2 mg/dL (ref 8.6–10.3)
Chloride: 103 mmol/L (ref 98–110)
Creat: 0.94 mg/dL (ref 0.70–1.33)
Glucose, Bld: 95 mg/dL (ref 65–99)
Potassium: 3.8 mmol/L (ref 3.5–5.3)
Sodium: 140 mmol/L (ref 135–146)

## 2017-03-24 ENCOUNTER — Telehealth: Payer: Self-pay | Admitting: Physician Assistant

## 2017-03-24 MED ORDER — LOSARTAN POTASSIUM-HCTZ 100-25 MG PO TABS: 1.0000 | ORAL_TABLET | Freq: Every day | ORAL | 0 refills | Status: DC

## 2017-03-24 NOTE — Telephone Encounter (Signed)
Request from pharmacy to switch BP medication to hyzaar due to cost. Will try but needs BP recheck in 4 weeks to make sure this controls level.   Tandy GawJade Breeback PA-C

## 2017-06-22 ENCOUNTER — Other Ambulatory Visit: Payer: Self-pay | Admitting: Physician Assistant

## 2017-08-12 ENCOUNTER — Other Ambulatory Visit: Payer: Self-pay | Admitting: Physician Assistant

## 2017-08-17 ENCOUNTER — Encounter: Payer: Self-pay | Admitting: Physician Assistant

## 2017-08-17 ENCOUNTER — Ambulatory Visit (INDEPENDENT_AMBULATORY_CARE_PROVIDER_SITE_OTHER): Payer: BLUE CROSS/BLUE SHIELD | Admitting: Physician Assistant

## 2017-08-17 VITALS — BP 125/77 | HR 82 | Wt 172.0 lb

## 2017-08-17 DIAGNOSIS — I10 Essential (primary) hypertension: Secondary | ICD-10-CM

## 2017-08-17 DIAGNOSIS — Z1211 Encounter for screening for malignant neoplasm of colon: Secondary | ICD-10-CM | POA: Diagnosis not present

## 2017-08-17 MED ORDER — LOSARTAN POTASSIUM-HCTZ 100-25 MG PO TABS: 1.0000 | ORAL_TABLET | Freq: Every day | ORAL | 3 refills | Status: DC

## 2017-08-17 NOTE — Progress Notes (Signed)
   Subjective:    Patient ID: Joel Johnston, male    DOB: 16-May-1961, 56 y.o.   MRN: [id number]  HPI Pt is a 56 yo male who presents to the clinic to follow up on HTN.   He is doing great on hyzaar. No problems or complaints. Denies any CP, palpitations, headaches, vision changes.   .. Active Ambulatory Problems    Diagnosis Date Noted  . Essential hypertension, benign 02/21/2011  . Asymptomatic hypertensive urgency 07/14/2016   Resolved Ambulatory Problems    Diagnosis Date Noted  . No Resolved Ambulatory Problems   Past Medical History:  Diagnosis Date  . Hypertension    .Marland Kitchen. Family History  Problem Relation Age of Onset  . Cancer Mother       Review of Systems  All other systems reviewed and are negative.      Objective:   Physical Exam  Constitutional: He is oriented to person, place, and time. He appears well-developed and well-nourished.  HENT:  Head: Normocephalic and atraumatic.  Cardiovascular: Normal rate, regular rhythm and normal heart sounds.   Pulmonary/Chest: Effort normal and breath sounds normal.  Neurological: He is alert and oriented to person, place, and time.  Psychiatric: He has a normal mood and affect. His behavior is normal.          Assessment & Plan:  Marland Kitchen.Marland Kitchen.Diagnoses and all orders for this visit:  Essential hypertension, benign -     losartan-hydrochlorothiazide (HYZAAR) 100-25 MG tablet; Take 1 tablet by mouth daily.  Colon cancer screening -     Ambulatory referral to Gastroenterology  .Marland Kitchen. [facility] 08/17/2017 01/02/2017  Decreased Interest 0 0  Down, Depressed, Hopeless 0 0  PHQ - 2 Score 0 0    Pt has up to date labs. Refilled hyzaar. Next appt needs to be CPE.   Colonoscopy ordered.  Pt declines flu shot.

## 2017-12-31 ENCOUNTER — Other Ambulatory Visit: Payer: Self-pay | Admitting: *Deleted

## 2017-12-31 DIAGNOSIS — I10 Essential (primary) hypertension: Secondary | ICD-10-CM

## 2017-12-31 MED ORDER — LOSARTAN POTASSIUM-HCTZ 100-25 MG PO TABS: 1.0000 | ORAL_TABLET | Freq: Every day | ORAL | 3 refills | Status: DC

## 2018-12-01 ENCOUNTER — Ambulatory Visit (INDEPENDENT_AMBULATORY_CARE_PROVIDER_SITE_OTHER): Payer: BLUE CROSS/BLUE SHIELD | Admitting: Physician Assistant

## 2018-12-01 ENCOUNTER — Encounter: Payer: Self-pay | Admitting: Physician Assistant

## 2018-12-01 VITALS — BP 158/99 | HR 80 | Ht 67.0 in | Wt 185.0 lb

## 2018-12-01 DIAGNOSIS — Z1159 Encounter for screening for other viral diseases: Secondary | ICD-10-CM | POA: Diagnosis not present

## 2018-12-01 DIAGNOSIS — Z125 Encounter for screening for malignant neoplasm of prostate: Secondary | ICD-10-CM | POA: Diagnosis not present

## 2018-12-01 DIAGNOSIS — I10 Essential (primary) hypertension: Secondary | ICD-10-CM | POA: Diagnosis not present

## 2018-12-01 DIAGNOSIS — Z13220 Encounter for screening for lipoid disorders: Secondary | ICD-10-CM

## 2018-12-01 DIAGNOSIS — Z0000 Encounter for general adult medical examination without abnormal findings: Secondary | ICD-10-CM

## 2018-12-01 DIAGNOSIS — Z1211 Encounter for screening for malignant neoplasm of colon: Secondary | ICD-10-CM

## 2018-12-01 DIAGNOSIS — G8929 Other chronic pain: Secondary | ICD-10-CM | POA: Insufficient documentation

## 2018-12-01 DIAGNOSIS — M25562 Pain in left knee: Secondary | ICD-10-CM

## 2018-12-01 DIAGNOSIS — Z131 Encounter for screening for diabetes mellitus: Secondary | ICD-10-CM

## 2018-12-01 MED ORDER — LOSARTAN POTASSIUM-HCTZ 100-25 MG PO TABS: 1.0000 | ORAL_TABLET | Freq: Every day | ORAL | 3 refills | Status: DC

## 2018-12-01 MED ORDER — DICLOFENAC SODIUM 1 % TD GEL: 4.0000 g | Freq: Four times a day (QID) | TRANSDERMAL | 1 refills | Status: DC

## 2018-12-01 MED ORDER — AMLODIPINE BESYLATE 5 MG PO TABS: 5.0000 mg | ORAL_TABLET | Freq: Every day | ORAL | 1 refills | Status: DC

## 2018-12-01 NOTE — Patient Instructions (Addendum)
glucosamin chondrotin Fish oil 4000mg  daily.  Icing knee  If no significant improvement get xrays and consider injfections with sports medicine.     Joint Pain  Joint pain can be caused by many things. It is likely to go away if you follow instructions from your doctor for taking care of yourself at home. Sometimes, you may need more treatment. Follow these instructions at home: Managing pain, stiffness, and swelling   If told, put ice on the painful area. ? Put ice in a plastic bag. ? Place a towel between your skin and the bag. ? Leave the ice on for 20 minutes, 2-3 times a day.  If told, put heat on the painful area. Do this as often as told by your doctor. Use the heat source that your doctor recommends, such as a moist heat pack or a heating pad. ? Place a towel between your skin and the heat source. ? Leave the heat on for 20-30 minutes. ? Take off the heat if your skin gets bright red. This is especially important if you are unable to feel pain, heat, or cold. You may have a greater risk of getting burned.  Move your fingers or toes below the painful joint often. This helps with stiffness and swelling.  If possible, raise (elevate) the painful joint above the level of your heart while you are sitting or lying down. To do this, try putting a few pillows under the painful joint. Activity  Rest the painful joint for as long as told. Do not do things that cause pain or make your pain worse.  Begin exercising or stretching the affected area, as told by your doctor. Ask your doctor what types of exercise are safe for you. If you have an elastic bandage, sling, or splint:  Wear the device as told by your doctor. Take it off only as told by your doctor.  Loosen the device if your fingers or toes below the joint: ? Tingle. ? Lose feeling (get numb). ? Get cold and blue.  Keep the device clean.  Ask your doctor if you should take off the device before bathing. You may need to  cover it with a watertight covering when you take a bath or a shower. General instructions  Take over-the-counter and prescription medicines only as told by your doctor.  Do not use any products that contain nicotine or tobacco. These include cigarettes and e-cigarettes. If you need help quitting, ask your doctor.  Keep all follow-up visits as told by your doctor. This is important. Contact a doctor if:  You have pain that gets worse and does not get better with medicine.  Your joint pain does not get better in 3 days.  You have more bruising or swelling.  You have a fever.  You lose 10 lb (4.5 kg) or more without trying. Get help right away if:  You cannot move the joint.  Your fingers or toes tingle, lose feeling, or get cold and blue.  You have a fever along with a joint that is red, warm, and swollen. Summary  Joint pain can be caused by many things. It often goes away if you follow instructions from your doctor for taking care of yourself at home.  Rest the painful joint for as long as told. Do not do things that cause pain or make your pain worse.  Take over-the-counter and prescription medicines only as told by your doctor. This information is not intended to replace advice given to  you by your health care provider. Make sure you discuss any questions you have with your health care provider. Document Released: 10/01/2009 Document Revised: 07/29/2017 Document Reviewed: 07/29/2017 Elsevier Interactive Patient Education  2019 [employer] Health Maintenance After Age 60 After age 60, you are at a higher risk for certain long-term diseases and infections as well as injuries from falls. Falls are a major cause of broken bones and head injuries in people who are older than age 60. Getting regular preventive care can help to keep you healthy and well. Preventive care includes getting regular testing and making lifestyle changes as recommended by your health care provider. Talk  with your health care provider about:  Which screenings and tests you should have. A screening is a test that checks for a disease when you have no symptoms.  A diet and exercise plan that is right for you. What should I know about screenings and tests to prevent falls? Screening and testing are the best ways to find a health problem early. Early diagnosis and treatment give you the best chance of managing medical conditions that are common after age 60. Certain conditions and lifestyle choices may make you more likely to have a fall. Your health care provider may recommend:  Regular vision checks. Poor vision and conditions such as cataracts can make you more likely to have a fall. If you wear glasses, make sure to get your prescription updated if your vision changes.  Medicine review. Work with your health care provider to regularly review all of the medicines you are taking, including over-the-counter medicines. Ask your health care provider about any side effects that may make you more likely to have a fall. Tell your health care provider if any medicines that you take make you feel dizzy or sleepy.  Osteoporosis screening. Osteoporosis is a condition that causes the bones to get weaker. This can make the bones weak and cause them to break more easily.  Blood pressure screening. Blood pressure changes and medicines to control blood pressure can make you feel dizzy.  Strength and balance checks. Your health care provider may recommend certain tests to check your strength and balance while standing, walking, or changing positions.  Foot health exam. Foot pain and numbness, as well as not wearing proper footwear, can make you more likely to have a fall.  Depression screening. You may be more likely to have a fall if you have a fear of falling, feel emotionally low, or feel unable to do activities that you used to do.  Alcohol use screening. Using too much alcohol can affect your balance and  may make you more likely to have a fall. What actions can I take to lower my risk of falls? General instructions  Talk with your health care provider about your risks for falling. Tell your health care provider if: ? You fall. Be sure to tell your health care provider about all falls, even ones that seem minor. ? You feel dizzy, sleepy, or off-balance.  Take over-the-counter and prescription medicines only as told by your health care provider. These include any supplements.  Eat a healthy diet and maintain a healthy weight. A healthy diet includes low-fat dairy products, low-fat (lean) meats, and fiber from whole grains, beans, and lots of fruits and vegetables. Home safety  Remove any tripping hazards, such as rugs, cords, and clutter.  Install safety equipment such as grab bars in bathrooms and safety rails on stairs.  Keep rooms and walkways well-lit. Activity  Follow a regular exercise program to stay fit. This will help you maintain your balance. Ask your health care provider what types of exercise are appropriate for you.  If you need a cane or walker, use it as recommended by your health care provider.  Wear supportive shoes that have nonskid soles. Lifestyle  Do not drink alcohol if your health care provider tells you not to drink.  If you drink alcohol, limit how much you have: ? 0-1 drink a day for women. ? 0-2 drinks a day for men.  Be aware of how much alcohol is in your drink. In the [place] one drink equals one typical bottle of beer (12 oz), one-half glass of wine (5 oz), or one shot of hard liquor (1 oz).  Do not use any products that contain nicotine or tobacco, such as cigarettes and e-cigarettes. If you need help quitting, ask your health care provider. Summary  Having a healthy lifestyle and getting preventive care can help to protect your health and wellness after age 60.  Screening and testing are the best way to find a health problem early and help you  avoid having a fall. Early diagnosis and treatment give you the best chance for managing medical conditions that are more common for people who are older than age 60.  Falls are a major cause of broken bones and head injuries in people who are older than age 60. Take precautions to prevent a fall at home.  Work with your health care provider to learn what changes you can make to improve your health and wellness and to prevent falls. This information is not intended to replace advice given to you by your health care provider. Make sure you discuss any questions you have with your health care provider. Document Released: 08/26/2017 Document Revised: 08/26/2017 Document Reviewed: 08/26/2017 Elsevier Interactive Patient Education  2019 [employer]

## 2018-12-01 NOTE — Progress Notes (Signed)
Subjective:    Patient ID: Joel Johnston, male    DOB: 1960-12-09, 58 y.o.   MRN: [id number]  HPI Pt is a 58 yo male who presents to the clinic for CPE.   Pt denies any problems or concerns.   .. Active Ambulatory Problems    Diagnosis Date Noted  . Essential hypertension, benign 02/21/2011  . Asymptomatic hypertensive urgency 07/14/2016  . Chronic pain of left knee 12/01/2018   Resolved Ambulatory Problems    Diagnosis Date Noted  . No Resolved Ambulatory Problems   Past Medical History:  Diagnosis Date  . Hypertension    .Marland Kitchen Family History  Problem Relation Age of Onset  . Cancer Mother    .Marland Kitchen Social History   Socioeconomic History  . Marital status: Married    Spouse name: Okey Regal  . Number of children: 0  . Years of education: Not on file  . Highest education level: Not on file  Occupational History  . Occupation: constructions    Comment: [employer]  Social Needs  . Financial resource strain: Not on file  . Food insecurity:    Worry: Not on file    Inability: Not on file  . Transportation needs:    Medical: Not on file    Non-medical: Not on file  Tobacco Use  . Smoking status: Former Smoker    Last attempt to quit: 10/28/1983    Years since quitting: 35.1  . Smokeless tobacco: Never Used  Substance and Sexual Activity  . Alcohol use: Yes    Alcohol/week: 2.0 standard drinks    Types: 2 Cans of beer per week  . Drug use: No  . Sexual activity: Yes  Lifestyle  . Physical activity:    Days per week: Not on file    Minutes per session: Not on file  . Stress: Not on file  Relationships  . Social connections:    Talks on phone: Not on file    Gets together: Not on file    Attends religious service: Not on file    Active member of club or organization: Not on file    Attends meetings of clubs or organizations: Not on file    Relationship status: Not on file  . Intimate partner violence:    Fear of current or ex partner: Not on file   Emotionally abused: Not on file    Physically abused: Not on file    Forced sexual activity: Not on file  Other Topics Concern  . Not on file  Social History Narrative  . Not on file      Review of Systems  All other systems reviewed and are negative.      Objective:   Physical Exam Vitals signs reviewed.   BP (!) 158/99   Pulse 80   Ht 5\' 7"  (1.702 m)   Wt 185 lb (83.9 kg)   BMI 28.98 kg/m   General Appearance:    Alert, cooperative, no distress, appears stated age  Head:    Normocephalic, without obvious abnormality, atraumatic  Eyes:    PERRL, conjunctiva/corneas clear, EOM's intact, fundi    benign, both eyes       Ears:    Normal TM's and external ear canals, both ears  Nose:   Nares normal, septum midline, mucosa normal, no drainage    or sinus tenderness  Throat:   Lips, mucosa, and tongue normal; teeth and gums normal  Neck:   Supple, symmetrical, trachea midline,  no adenopathy;       thyroid:  No enlargement/tenderness/nodules; no carotid   bruit or JVD  Back:     Symmetric, no curvature, ROM normal, no CVA tenderness  Lungs:     Clear to auscultation bilaterally, respirations unlabored  Chest wall:    No tenderness or deformity  Heart:    Regular rate and rhythm, S1 and S2 normal, no murmur, rub   or gallop  Abdomen:     Soft, non-tender, bowel sounds active all four quadrants,    no masses, no organomegaly        Extremities:   Extremities normal, atraumatic, no cyanosis or edema  Pulses:   2+ and symmetric all extremities  Skin:   Skin color, texture, turgor normal, no rashes or lesions  Lymph nodes:   Cervical, supraclavicular, and axillary nodes normal  Neurologic:   CNII-XII intact. Normal strength, sensation and reflexes      throughout          Assessment & Plan:  Marland Kitchen.Marland Kitchen.Joel Johnston was seen today for annual exam.  Diagnoses and all orders for this visit:  Routine physical examination -     Ambulatory referral to Gastroenterology -     Hepatitis C  Antibody -     Lipid Panel w/reflex Direct LDL -     COMPLETE METABOLIC PANEL WITH GFR -     PSA -     CBC with Differential/Platelet  Essential hypertension, benign -     amLODipine (NORVASC) 5 MG tablet; Take 1 tablet (5 mg total) by mouth daily. -     losartan-hydrochlorothiazide (HYZAAR) 100-25 MG tablet; Take 1 tablet by mouth daily.  Colon cancer screening -     Ambulatory referral to Gastroenterology  Need for hepatitis C screening test -     Hepatitis C Antibody  Screening for lipid disorders -     Lipid Panel w/reflex Direct LDL  Screening for diabetes mellitus -     COMPLETE METABOLIC PANEL WITH GFR  Prostate cancer screening -     PSA  Chronic pain of left knee -     diclofenac sodium (VOLTAREN) 1 % GEL; Apply 4 g topically 4 (four) times daily. To affected joint.   .. [facility] 2[facility] 12/01/2018 08/17/2017 01/02/2017  Decreased Interest 0 0 0  Down, Depressed, Hopeless 0 0 0  PHQ - 2 Score 0 0 0  Altered sleeping 0 - -  Tired, decreased energy 0 - -  Change in appetite 0 - -  Trouble concentrating 0 - -  Moving slowly or fidgety/restless 0 - -  Suicidal thoughts 0 - -  PHQ-9 Score 0 - -  Difficult doing work/chores Not difficult at all - -   .Marland Kitchen.IPSS Questionnaire (AUA-7): Over the past month.   1)  How often have you had a sensation of not emptying your bladder completely after you finish urinating?  0 - Not at all  2)  How often have you had to urinate again less than two hours after you finished urinating? 0 - Not at all  3)  How often have you found you stopped and started again several times when you urinated?  0 - Not at all  4) How difficult have you found it to postpone urination?  0 - Not at all  5) How often have you had a weak urinary stream?  0 - Not at all  6) How often have you had to push or strain to begin  urination?  0 - Not at all  7) How many times did you most typically get up to urinate from the time you went to bed until the time  you got up in the morning?  1 - 1 time  Total score:  0-7 mildly symptomatic   8-19 moderately symptomatic   20-35 severely symptomatic     .Marland Kitchen.Start a regular exercise program and make sure you are eating a healthy diet Try to eat 4 servings of dairy a day or take a calcium supplement (500mg  twice a day). Your vaccines are up to date.  Discussed shingrix will consider.  Fasting labs ordered.  colonoscopy order.  Prostate screening ordered.   BP not controlled. Added norvasc. Follow up in 2weeks for nurse BP check.   Discussed knee pain sounds like OA. HO given. Symptomatic care discussed. If continues consider xrays and sports medicine for injections.  Encouraged good supportive shoes. Discussed OTC glucosamine chrondrotin/tumeric. Use voltaren gel as needed. Consider ibuprofen as needed.

## 2018-12-02 LAB — COMPLETE METABOLIC PANEL WITH GFR
AG Ratio: 1.6 (calc) (ref 1.0–2.5)
ALT: 24 U/L (ref 9–46)
AST: 21 U/L (ref 10–35)
Albumin: 4.4 g/dL (ref 3.6–5.1)
Alkaline phosphatase (APISO): 94 U/L (ref 35–144)
BUN: 13 mg/dL (ref 7–25)
CO2: 27 mmol/L (ref 20–32)
Calcium: 9.6 mg/dL (ref 8.6–10.3)
Chloride: 103 mmol/L (ref 98–110)
Creat: 1.04 mg/dL (ref 0.70–1.33)
GFR, Est African American: 91 mL/min/{1.73_m2} (ref >=60)
GFR, Est Non African American: 78 mL/min/{1.73_m2} (ref >=60)
Globulin: 2.7 g/dL (calc) (ref 1.9–3.7)
Glucose, Bld: 103 mg/dL — ABNORMAL HIGH (ref 65–99)
Potassium: 4.1 mmol/L (ref 3.5–5.3)
Sodium: 141 mmol/L (ref 135–146)
Total Bilirubin: 0.8 mg/dL (ref 0.2–1.2)
Total Protein: 7.1 g/dL (ref 6.1–8.1)

## 2018-12-02 LAB — CBC WITH DIFFERENTIAL/PLATELET
Absolute Monocytes: 576 cells/uL (ref 200–950)
Basophils Absolute: 68 cells/uL (ref 0–200)
Basophils Relative: 1.2 %
Eosinophils Absolute: 268 cells/uL (ref 15–500)
Eosinophils Relative: 4.7 %
HCT: 52.2 % — ABNORMAL HIGH (ref 38.5–50.0)
Hemoglobin: 17.7 g/dL — ABNORMAL HIGH (ref 13.2–17.1)
Lymphs Abs: 2177 cells/uL (ref 850–3900)
MCH: 29.5 pg (ref 27.0–33.0)
MCHC: 33.9 g/dL (ref 32.0–36.0)
MCV: 87.1 fL (ref 80.0–100.0)
MPV: 9.9 fL (ref 7.5–12.5)
Monocytes Relative: 10.1 %
Neutro Abs: 2611 cells/uL (ref 1500–7800)
Neutrophils Relative %: 45.8 %
Platelets: 242 10*3/uL (ref 140–400)
RBC: 5.99 10*6/uL — ABNORMAL HIGH (ref 4.20–5.80)
RDW: 13.8 % (ref 11.0–15.0)
Total Lymphocyte: 38.2 %
WBC: 5.7 10*3/uL (ref 3.8–10.8)

## 2018-12-02 LAB — PSA: PSA: 1.4 ng/mL (ref <=4.0)

## 2018-12-02 LAB — LIPID PANEL W/REFLEX DIRECT LDL
Cholesterol: 144 mg/dL (ref <200)
HDL: 40 mg/dL (ref >=40)
LDL Cholesterol (Calc): 86 mg/dL (calc)
Non-HDL Cholesterol (Calc): 104 mg/dL (calc) (ref <130)
Total CHOL/HDL Ratio: 3.6 (calc) (ref <5.0)
Triglycerides: 87 mg/dL (ref <150)

## 2018-12-02 LAB — HEPATITIS C ANTIBODY
Hepatitis C Ab: NONREACTIVE
SIGNAL TO CUT-OFF: 0.03 (ref <1.00)

## 2018-12-02 NOTE — Progress Notes (Signed)
Call pt: cholesterol looks great. Prostate great. Kidney and liver look great. Ok to use NSAIDs OTC for pain relief as needed.

## 2018-12-15 ENCOUNTER — Ambulatory Visit (INDEPENDENT_AMBULATORY_CARE_PROVIDER_SITE_OTHER): Payer: BLUE CROSS/BLUE SHIELD | Admitting: Physician Assistant

## 2018-12-15 VITALS — BP 135/88 | HR 82 | Temp 98.4°F | Wt 188.4 lb

## 2018-12-15 DIAGNOSIS — I10 Essential (primary) hypertension: Secondary | ICD-10-CM

## 2018-12-15 MED ORDER — AMLODIPINE BESYLATE 5 MG PO TABS: 5.0000 mg | ORAL_TABLET | Freq: Every day | ORAL | 3 refills | Status: DC

## 2018-12-15 NOTE — Progress Notes (Signed)
Patient in today for BP check. BP at last visit was158/99 . Patient reports he is taking the norvasc that was added to his regimen. BP in the office today is 135/88 pulse 82. Pt denies headaches, blurred vision, chest pain and shortness of breath. Pt advised to continue to monitor BP. If BP seems to elevating to call office for return BP check.Pt is to continue with current medications, Provider will be notified . If any changes Pt will be called and advised.  Much better. Keep regularly scheduled follow ups. Every 6 months. Tandy Gaw PA-C

## 2019-03-25 DIAGNOSIS — K635 Polyp of colon: Secondary | ICD-10-CM | POA: Diagnosis not present

## 2019-03-25 DIAGNOSIS — D125 Benign neoplasm of sigmoid colon: Secondary | ICD-10-CM | POA: Diagnosis not present

## 2019-03-25 DIAGNOSIS — Z1211 Encounter for screening for malignant neoplasm of colon: Secondary | ICD-10-CM | POA: Diagnosis not present

## 2019-03-25 LAB — HM COLONOSCOPY

## 2019-03-31 ENCOUNTER — Encounter: Payer: Self-pay | Admitting: Neurology

## 2019-11-01 ENCOUNTER — Other Ambulatory Visit: Payer: Self-pay | Admitting: Physician Assistant

## 2019-11-01 DIAGNOSIS — M25562 Pain in left knee: Secondary | ICD-10-CM

## 2019-11-01 DIAGNOSIS — G8929 Other chronic pain: Secondary | ICD-10-CM

## 2019-11-20 ENCOUNTER — Other Ambulatory Visit: Payer: Self-pay | Admitting: Physician Assistant

## 2019-11-20 DIAGNOSIS — I10 Essential (primary) hypertension: Secondary | ICD-10-CM

## 2019-12-07 DIAGNOSIS — Z20828 Contact with and (suspected) exposure to other viral communicable diseases: Secondary | ICD-10-CM | POA: Diagnosis not present

## 2020-01-16 ENCOUNTER — Ambulatory Visit (INDEPENDENT_AMBULATORY_CARE_PROVIDER_SITE_OTHER): Payer: BC Managed Care – PPO

## 2020-01-16 ENCOUNTER — Encounter: Payer: Self-pay | Admitting: Physician Assistant

## 2020-01-16 ENCOUNTER — Other Ambulatory Visit: Payer: Self-pay | Admitting: Physician Assistant

## 2020-01-16 ENCOUNTER — Ambulatory Visit (INDEPENDENT_AMBULATORY_CARE_PROVIDER_SITE_OTHER): Payer: BC Managed Care – PPO | Admitting: Physician Assistant

## 2020-01-16 ENCOUNTER — Other Ambulatory Visit: Payer: Self-pay

## 2020-01-16 VITALS — BP 134/87 | HR 79 | Ht 67.0 in | Wt 178.0 lb

## 2020-01-16 DIAGNOSIS — Z131 Encounter for screening for diabetes mellitus: Secondary | ICD-10-CM

## 2020-01-16 DIAGNOSIS — M25562 Pain in left knee: Secondary | ICD-10-CM

## 2020-01-16 DIAGNOSIS — M25561 Pain in right knee: Secondary | ICD-10-CM

## 2020-01-16 DIAGNOSIS — G8929 Other chronic pain: Secondary | ICD-10-CM

## 2020-01-16 DIAGNOSIS — I10 Essential (primary) hypertension: Secondary | ICD-10-CM

## 2020-01-16 DIAGNOSIS — M1712 Unilateral primary osteoarthritis, left knee: Secondary | ICD-10-CM | POA: Diagnosis not present

## 2020-01-16 DIAGNOSIS — Z0000 Encounter for general adult medical examination without abnormal findings: Secondary | ICD-10-CM | POA: Diagnosis not present

## 2020-01-16 DIAGNOSIS — Z13220 Encounter for screening for lipoid disorders: Secondary | ICD-10-CM

## 2020-01-16 DIAGNOSIS — M25462 Effusion, left knee: Secondary | ICD-10-CM | POA: Diagnosis not present

## 2020-01-16 MED ORDER — LOSARTAN POTASSIUM-HCTZ 100-25 MG PO TABS: 1.0000 | ORAL_TABLET | Freq: Every day | ORAL | 3 refills | Status: DC

## 2020-01-16 MED ORDER — MELOXICAM 15 MG PO TABS: 15.0000 mg | ORAL_TABLET | Freq: Every day | ORAL | 0 refills | Status: DC

## 2020-01-16 MED ORDER — AMLODIPINE BESYLATE 5 MG PO TABS: 5.0000 mg | ORAL_TABLET | Freq: Every day | ORAL | 3 refills | Status: DC

## 2020-01-16 NOTE — Patient Instructions (Addendum)
Osteoarthritis  Osteoarthritis is a type of arthritis that affects tissue that covers the ends of bones in joints (cartilage). Cartilage acts as a cushion between the bones and helps them move smoothly. Osteoarthritis results when cartilage in the joints gets worn down. Osteoarthritis is sometimes called "wear and tear" arthritis. Osteoarthritis is the most common form of arthritis. It often occurs in older people. It is a condition that gets worse over time (a progressive condition). Joints that are most often affected by this condition are in:  Fingers.  Toes.  Hips.  Knees.  Spine, including neck and lower back. What are the causes? This condition is caused by age-related wearing down of cartilage that covers the ends of bones. What increases the risk? The following factors may make you more likely to develop this condition:  Older age.  Being overweight or obese.  Overuse of joints, such as in athletes.  Past injury of a joint.  Past surgery on a joint.  Family history of osteoarthritis. What are the signs or symptoms? The main symptoms of this condition are pain, swelling, and stiffness in the joint. The joint may lose its shape over time. Small pieces of bone or cartilage may break off and float inside of the joint, which may cause more pain and damage to the joint. Small deposits of bone (osteophytes) may grow on the edges of the joint. Other symptoms may include:  A grating or scraping feeling inside the joint when you move it.  Popping or creaking sounds when you move. Symptoms may affect one or more joints. Osteoarthritis in a major joint, such as your knee or hip, can make it painful to walk or exercise. If you have osteoarthritis in your hands, you might not be able to grip items, twist your hand, or control small movements of your hands and fingers (fine motor skills). How is this diagnosed? This condition may be diagnosed based on:  Your medical history.  A  physical exam.  Your symptoms.  X-rays of the affected joint(s).  Blood tests to rule out other types of arthritis. How is this treated? There is no cure for this condition, but treatment can help to control pain and improve joint function. Treatment plans may include:  A prescribed exercise program that allows for rest and joint relief. You may work with a physical therapist.  A weight control plan.  Pain relief techniques, such as: ? Applying heat and cold to the joint. ? Electric pulses delivered to nerve endings under the skin (transcutaneous electrical nerve stimulation, or TENS). ? Massage. ? Certain nutritional supplements.  NSAIDs or prescription medicines to help relieve pain.  Medicine to help relieve pain and inflammation (corticosteroids). This can be given by mouth (orally) or as an injection.  Assistive devices, such as a brace, wrap, splint, specialized glove, or cane.  Surgery, such as: ? An osteotomy. This is done to reposition the bones and relieve pain or to remove loose pieces of bone and cartilage. ? Joint replacement surgery. You may need this surgery if you have very bad (advanced) osteoarthritis. Follow these instructions at home: Activity  Rest your affected joints as directed by your health care provider.  Do not drive or use heavy machinery while taking prescription pain medicine.  Exercise as directed. Your health care provider or physical therapist may recommend specific types of exercise, such as: ? Strengthening exercises. These are done to strengthen the muscles that support joints that are affected by arthritis. They can be performed   with weights or with exercise bands to add resistance. ? Aerobic activities. These are exercises, such as brisk walking or water aerobics, that get your heart pumping. ? Range-of-motion activities. These keep your joints easy to move. ? Balance and agility exercises. Managing pain, stiffness, and swelling       If directed, apply heat to the affected area as often as told by your health care provider. Use the heat source that your health care provider recommends, such as a moist heat pack or a heating pad. ? If you have a removable assistive device, remove it as told by your health care provider. ? Place a towel between your skin and the heat source. If your health care provider tells you to keep the assistive device on while you apply heat, place a towel between the assistive device and the heat source. ? Leave the heat on for 20-30 minutes. ? Remove the heat if your skin turns bright red. This is especially important if you are unable to feel pain, heat, or cold. You may have a greater risk of getting burned.  If directed, put ice on the affected joint: ? If you have a removable assistive device, remove it as told by your health care provider. ? Put ice in a plastic bag. ? Place a towel between your skin and the bag. If your health care provider tells you to keep the assistive device on during icing, place a towel between the assistive device and the bag. ? Leave the ice on for 20 minutes, 2-3 times a day. General instructions  Take over-the-counter and prescription medicines only as told by your health care provider.  Maintain a healthy weight. Follow instructions from your health care provider for weight control. These may include dietary restrictions.  Do not use any products that contain nicotine or tobacco, such as cigarettes and e-cigarettes. These can delay bone healing. If you need help quitting, ask your health care provider.  Use assistive devices as directed by your health care provider.  Keep all follow-up visits as told by your health care provider. This is important. Where to find more information  [employer] of Arthritis and Musculoskeletal and Skin Diseases: www.niams.http://www.myers.net/  [employer] on Aging: https://walker.com/  American College of Rheumatology:  www.rheumatology.org Contact a health care provider if:  Your skin turns red.  You develop a rash.  You have pain that gets worse.  You have a fever along with joint or muscle aches. Get help right away if:  You lose a lot of weight.  You suddenly lose your appetite.  You have night sweats. Summary  Osteoarthritis is a type of arthritis that affects tissue covering the ends of bones in joints (cartilage).  This condition is caused by age-related wearing down of cartilage that covers the ends of bones.  The main symptom of this condition is pain, swelling, and stiffness in the joint.  There is no cure for this condition, but treatment can help to control pain and improve joint function. This information is not intended to replace advice given to you by your health care provider. Make sure you discuss any questions you have with your health care provider. Document Revised: 09/25/2017 Document Reviewed: 06/16/2016 Elsevier Patient Education  2020 [employer]. Health Maintenance, Male Adopting a healthy lifestyle and getting preventive care are important in promoting health and wellness. Ask your health care provider about:  The right schedule for you to have regular tests and exams.  Things you can do on  your own to prevent diseases and keep yourself healthy. What should I know about diet, weight, and exercise? Eat a healthy diet   Eat a diet that includes plenty of vegetables, fruits, low-fat dairy products, and lean protein.  Do not eat a lot of foods that are high in solid fats, added sugars, or sodium. Maintain a healthy weight Body mass index (BMI) is a measurement that can be used to identify possible weight problems. It estimates body fat based on height and weight. Your health care provider can help determine your BMI and help you achieve or maintain a healthy weight. Get regular exercise Get regular exercise. This is one of the most important things you can do for  your health. Most adults should:  Exercise for at least 150 minutes each week. The exercise should increase your heart rate and make you sweat (moderate-intensity exercise).  Do strengthening exercises at least twice a week. This is in addition to the moderate-intensity exercise.  Spend less time sitting. Even light physical activity can be beneficial. Watch cholesterol and blood lipids Have your blood tested for lipids and cholesterol at 61 years of age, then have this test every 5 years. You may need to have your cholesterol levels checked more often if:  Your lipid or cholesterol levels are high.  You are older than 61 years of age.  You are at high risk for heart disease. What should I know about cancer screening? Many types of cancers can be detected early and may often be prevented. Depending on your health history and family history, you may need to have cancer screening at various ages. This may include screening for:  Colorectal cancer.  Prostate cancer.  Skin cancer.  Lung cancer. What should I know about heart disease, diabetes, and high blood pressure? Blood pressure and heart disease  High blood pressure causes heart disease and increases the risk of stroke. This is more likely to develop in people who have high blood pressure readings, are of African descent, or are overweight.  Talk with your health care provider about your target blood pressure readings.  Have your blood pressure checked: ? Every 3-5 years if you are 40-61 years of age. ? Every year if you are 58 years old or older.  If you are between the ages of 51 and 21 and are a current or former smoker, ask your health care provider if you should have a one-time screening for abdominal aortic aneurysm (AAA). Diabetes Have regular diabetes screenings. This checks your fasting blood sugar level. Have the screening done:  Once every three years after age 47 if you are at a normal weight and have a low risk  for diabetes.  More often and at a younger age if you are overweight or have a high risk for diabetes. What should I know about preventing infection? Hepatitis B If you have a higher risk for hepatitis B, you should be screened for this virus. Talk with your health care provider to find out if you are at risk for hepatitis B infection. Hepatitis C Blood testing is recommended for:  Everyone born from [birth year] through 1967.  Anyone with known risk factors for hepatitis C. Sexually transmitted infections (STIs)  You should be screened each year for STIs, including gonorrhea and chlamydia, if: ? You are sexually active and are younger than 61 years of age. ? You are older than 61 years of age and your health care provider tells you that you are at risk  for this type of infection. ? Your sexual activity has changed since you were last screened, and you are at increased risk for chlamydia or gonorrhea. Ask your health care provider if you are at risk.  Ask your health care provider about whether you are at high risk for HIV. Your health care provider may recommend a prescription medicine to help prevent HIV infection. If you choose to take medicine to prevent HIV, you should first get tested for HIV. You should then be tested every 3 months for as long as you are taking the medicine. Follow these instructions at home: Lifestyle  Do not use any products that contain nicotine or tobacco, such as cigarettes, e-cigarettes, and chewing tobacco. If you need help quitting, ask your health care provider.  Do not use street drugs.  Do not share needles.  Ask your health care provider for help if you need support or information about quitting drugs. Alcohol use  Do not drink alcohol if your health care provider tells you not to drink.  If you drink alcohol: ? Limit how much you have to 0-2 drinks a day. ? Be aware of how much alcohol is in your drink. In the [place] one drink equals one 12 oz bottle  of beer (355 mL), one 5 oz glass of wine (148 mL), or one 1 oz glass of hard liquor (44 mL). General instructions  Schedule regular health, dental, and eye exams.  Stay current with your vaccines.  Tell your health care provider if: ? You often feel depressed. ? You have ever been abused or do not feel safe at home. Summary  Adopting a healthy lifestyle and getting preventive care are important in promoting health and wellness.  Follow your health care provider's instructions about healthy diet, exercising, and getting tested or screened for diseases.  Follow your health care provider's instructions on monitoring your cholesterol and blood pressure. This information is not intended to replace advice given to you by your health care provider. Make sure you discuss any questions you have with your health care provider. Document Revised: 10/06/2018 Document Reviewed: 10/06/2018 Elsevier Patient Education  2020 [employer]  Prediabetes Eating Plan Prediabetes is a condition that causes blood sugar (glucose) levels to be higher than normal. This increases the risk for developing diabetes. In order to prevent diabetes from developing, your health care provider may recommend a diet and other lifestyle changes to help you:  Control your blood glucose levels.  Improve your cholesterol levels.  Manage your blood pressure. Your health care provider may recommend working with a diet and nutrition specialist (dietitian) to make a meal plan that is best for you. What are tips for following this plan? Lifestyle  Set weight loss goals with the help of your health care team. It is recommended that most people with prediabetes lose 7% of their current body weight.  Exercise for at least 30 minutes at least 5 days a week.  Attend a support group or seek ongoing support from a mental health counselor.  Take over-the-counter and prescription medicines only as told by your health care  provider. Reading food labels  Read food labels to check the amount of fat, salt (sodium), and sugar in prepackaged foods. Avoid foods that have: ? Saturated fats. ? Trans fats. ? Added sugars.  Avoid foods that have more than 300 milligrams (mg) of sodium per serving. Limit your daily sodium intake to less than 2,300 mg each day. Shopping  Avoid buying pre-made and  processed foods. Cooking  Cook with olive oil. Do not use butter, lard, or ghee.  Bake, broil, grill, or boil foods. Avoid frying. Meal planning   Work with your dietitian to develop an eating plan that is right for you. This may include: ? Tracking how many calories you take in. Use a food diary, notebook, or mobile application to track what you eat at each meal. ? Using the glycemic index (GI) to plan your meals. The index tells you how quickly a food will raise your blood glucose. Choose low-GI foods. These foods take a longer time to raise blood glucose.  Consider following a Mediterranean diet. This diet includes: ? Several servings each day of fresh fruits and vegetables. ? Eating fish at least twice a week. ? Several servings each day of whole grains, beans, nuts, and seeds. ? Using olive oil instead of other fats. ? Moderate alcohol consumption. ? Eating small amounts of red meat and whole-fat dairy.  If you have high blood pressure, you may need to limit your sodium intake or follow a diet such as the DASH eating plan. DASH is an eating plan that aims to lower high blood pressure. What foods are recommended? The items listed below may not be a complete list. Talk with your dietitian about what dietary choices are best for you. Grains Whole grains, such as whole-wheat or whole-grain breads, crackers, cereals, and pasta. Unsweetened oatmeal. Bulgur. Barley. Quinoa. Brown rice. Corn or whole-wheat flour tortillas or taco shells. Vegetables Lettuce. Spinach. Peas. Beets. Cauliflower. Cabbage. Broccoli. Carrots.  Tomatoes. Squash. Eggplant. Herbs. Peppers. Onions. Cucumbers. Brussels sprouts. Fruits Berries. Bananas. Apples. Oranges. Grapes. Papaya. Mango. Pomegranate. Kiwi. Grapefruit. Cherries. Meats and other protein foods Seafood. Poultry without skin. Lean cuts of pork and beef. Tofu. Eggs. Nuts. Beans. Dairy Low-fat or fat-free dairy products, such as yogurt, cottage cheese, and cheese. Beverages Water. Tea. Coffee. Sugar-free or diet soda. Seltzer water. Lowfat or no-fat milk. Milk alternatives, such as soy or almond milk. Fats and oils Olive oil. Canola oil. Sunflower oil. Grapeseed oil. Avocado. Walnuts. Sweets and desserts Sugar-free or low-fat pudding. Sugar-free or low-fat ice cream and other frozen treats. Seasoning and other foods Herbs. Sodium-free spices. Mustard. Relish. Low-fat, low-sugar ketchup. Low-fat, low-sugar barbecue sauce. Low-fat or fat-free mayonnaise. What foods are not recommended? The items listed below may not be a complete list. Talk with your dietitian about what dietary choices are best for you. Grains Refined white flour and flour products, such as bread, pasta, snack foods, and cereals. Vegetables Canned vegetables. Frozen vegetables with butter or cream sauce. Fruits Fruits canned with syrup. Meats and other protein foods Fatty cuts of meat. Poultry with skin. Breaded or fried meat. Processed meats. Dairy Full-fat yogurt, cheese, or milk. Beverages Sweetened drinks, such as sweet iced tea and soda. Fats and oils Butter. Lard. Ghee. Sweets and desserts Baked goods, such as cake, cupcakes, pastries, cookies, and cheesecake. Seasoning and other foods Spice mixes with added salt. Ketchup. Barbecue sauce. Mayonnaise. Summary  To prevent diabetes from developing, you may need to make diet and other lifestyle changes to help control blood sugar, improve cholesterol levels, and manage your blood pressure.  Set weight loss goals with the help of your health  care team. It is recommended that most people with prediabetes lose 7 percent of their current body weight.  Consider following a Mediterranean diet that includes plenty of fresh fruits and vegetables, whole grains, beans, nuts, seeds, fish, lean meat, low-fat dairy, and healthy  oils. This information is not intended to replace advice given to you by your health care provider. Make sure you discuss any questions you have with your health care provider. Document Revised: 02/04/2019 Document Reviewed: 12/17/2016 Elsevier Patient Education  2020 [employer].

## 2020-01-16 NOTE — Progress Notes (Signed)
Subjective:    Patient ID: Joel Johnston, male    DOB: 1961-03-18, 59 y.o.   MRN: [id number]  HPI  Patient is a 59 year old male who presents to the clinic for his annual exam.  He is doing well with his blood pressure.  He takes his medication daily.  He denies any chest pain, palpitations, headaches, vision changes, dizziness.  Patient is still very active at work.  He walks on concrete floors at least 8 hours every day.  He has noticed that both of his knees are hurting and achy a lot.  At times he noticed some swelling.  His left seems to hurt a little more than his right.  He takes some Tylenol arthritis which helps minimally. No injury. progressively worse over last few years.   .. Active Ambulatory Problems    Diagnosis Date Noted  . Essential hypertension, benign 02/21/2011  . Asymptomatic hypertensive urgency 07/14/2016  . Chronic pain of left knee 12/01/2018  . Bilateral primary osteoarthritis of knee 01/17/2020   Resolved Ambulatory Problems    Diagnosis Date Noted  . No Resolved Ambulatory Problems   Past Medical History:  Diagnosis Date  . Hyperplastic polyps of stomach   . Hypertension    .Marland Kitchen Family History  Problem Relation Age of Onset  . Cancer Mother    .Marland Kitchen Social History   Socioeconomic History  . Marital status: Married    Spouse name: Arbie Cookey  . Number of children: 0  . Years of education: Not on file  . Highest education level: Not on file  Occupational History  . Occupation: constructions    Comment: [employer]  Tobacco Use  . Smoking status: Former Smoker    Quit date: 10/28/1983    Years since quitting: 36.2  . Smokeless tobacco: Never Used  Substance and Sexual Activity  . Alcohol use: Yes    Alcohol/week: 2.0 standard drinks    Types: 2 Cans of beer per week  . Drug use: No  . Sexual activity: Yes  Other Topics Concern  . Not on file  Social History Narrative  . Not on file   Social Determinants of Health   Financial  Resource Strain:   . Difficulty of Paying Living Expenses:   Food Insecurity:   . Worried About Charity fundraiser in the Last Year:   . Arboriculturist in the Last Year:   Transportation Needs:   . Film/video editor (Medical):   Marland Kitchen Lack of Transportation (Non-Medical):   Physical Activity:   . Days of Exercise per Week:   . Minutes of Exercise per Session:   Stress:   . Feeling of Stress :   Social Connections:   . Frequency of Communication with Friends and Family:   . Frequency of Social Gatherings with Friends and Family:   . Attends Religious Services:   . Active Member of Clubs or Organizations:   . Attends Archivist Meetings:   Marland Kitchen Marital Status:   Intimate Partner Violence:   . Fear of Current or Ex-Partner:   . Emotionally Abused:   Marland Kitchen Physically Abused:   . Sexually Abused:      Review of Systems See HPI.     Objective:   Physical Exam BP 134/87   Pulse 79   Ht 5\' 7"  (1.702 m)   Wt 178 lb (80.7 kg)   SpO2 96%   BMI 27.88 kg/m   General Appearance:    Alert,  cooperative, no distress, appears stated age  Head:    Normocephalic, without obvious abnormality, atraumatic  Eyes:    PERRL, conjunctiva/corneas clear, EOM's intact, fundi    benign, both eyes       Ears:    Normal TM's and external ear canals, both ears  Nose:   Nares normal, septum midline, mucosa normal, no drainage    or sinus tenderness  Throat:   Lips, mucosa, and tongue normal; teeth and gums normal  Neck:   Supple, symmetrical, trachea midline, no adenopathy;       thyroid:  No enlargement/tenderness/nodules; no carotid   bruit or JVD  Back:     Symmetric, no curvature, ROM normal, no CVA tenderness  Lungs:     Clear to auscultation bilaterally, respirations unlabored  Chest wall:    No tenderness or deformity  Heart:    Regular rate and rhythm, S1 and S2 normal, no murmur, rub   or gallop  Abdomen:     Soft, non-tender, bowel sounds active all four quadrants,    no masses,  no organomegaly        Extremities:   Extremities normal, atraumatic, no cyanosis or edema. NROM of both knees. No swelling. Some minor joint tenderness to palpation bilaterally.   Pulses:   2+ and symmetric all extremities  Skin:   Skin color, texture, turgor normal, no rashes or lesions  Lymph nodes:   Cervical, supraclavicular, and axillary nodes normal  Neurologic:   CNII-XII intact. Normal strength, sensation and reflexes      throughout         Assessment & Plan:  Marland KitchenMarland KitchenSammy was seen today for annual exam.  Diagnoses and all orders for this visit:  Routine physical examination -     Lipid Panel w/reflex Direct LDL -     COMPLETE METABOLIC PANEL WITH GFR -     CBC  Essential hypertension, benign -     losartan-hydrochlorothiazide (HYZAAR) 100-25 MG tablet; Take 1 tablet by mouth daily. -     amLODipine (NORVASC) 5 MG tablet; Take 1 tablet (5 mg total) by mouth daily. -     COMPLETE METABOLIC PANEL WITH GFR  Chronic pain of both knees -     meloxicam (MOBIC) 15 MG tablet; Take 1 tablet (15 mg total) by mouth daily. -     Cancel: DG Knee Bilateral Standing AP -     DG Knee Complete 4 Views Left  Screening for lipid disorders -     Lipid Panel w/reflex Direct LDL  Screening for diabetes mellitus -     COMPLETE METABOLIC PANEL WITH GFR   .Marland Kitchen Depression screen [facility] 2/9 01/16/2020 12/01/2018 08/17/2017 01/02/2017  Decreased Interest 0 0 0 0  Down, Depressed, Hopeless 0 0 0 0  PHQ - 2 Score 0 0 0 0  Altered sleeping 0 0 - -  Tired, decreased energy 0 0 - -  Change in appetite 0 0 - -  Feeling bad or failure about yourself  0 - - -  Trouble concentrating 0 0 - -  Moving slowly or fidgety/restless 0 0 - -  Suicidal thoughts 0 0 - -  PHQ-9 Score 0 0 - -  Difficult doing work/chores Not difficult at all Not difficult at all - -  . RO-AUA SYMPTOM    Row Name 01/16/20 1500         During the last Month   Sensation of Bladder not Empty  Not at all  Urinate<2 hours after  last  Not at all     Mult. stop/start when voiding  Not at all     Difficult to postpone voiding  Not at all     Weak urinary stream  Not at all     Push/strain to begin urination  Not at all     Times per night up to urinate  Not at all       OTHER   Total Score  0        .Marland KitchenStart a regular exercise program and make sure you are eating a healthy diet Try to eat 4 servings of dairy a day or take a calcium supplement (500mg  twice a day). Colonoscopy up-to-date AUA 0.  PSA checked last year and stable. Patient does need shingles vaccine however he is in the middle of his Covid vaccine.  He will come back 1 month after his last Covid vaccine to start his shingles series. Fasting labs ordered today.  Patient is having some bilateral knee pain.  Sounds like osteoarthritis.  We will get x-rays to confirm.  Start Mobic and can stop his Tylenol arthritis.  Last recheck of kidney function was good.  We will get him in with Dr. after x-rays to consider injections.

## 2020-01-17 ENCOUNTER — Encounter: Payer: Self-pay | Admitting: Physician Assistant

## 2020-01-17 DIAGNOSIS — M170 Bilateral primary osteoarthritis of knee: Secondary | ICD-10-CM | POA: Insufficient documentation

## 2020-01-17 NOTE — Progress Notes (Signed)
Knee osteoarthritis. Continue on mobic and follow up with sports medicine Dr. Karie Schwalbe in 2 weeks.

## 2020-01-17 NOTE — Progress Notes (Signed)
Call pt:  Fasting glucose is up a hair. Please add a1c.  Cholesterol looks good pending no new findings with a1c.

## 2020-01-17 NOTE — Progress Notes (Signed)
Bilateral knee arthritis. Continue on mobic and follow up in 2 weeks with Dr. Karie Schwalbe.

## 2020-01-18 ENCOUNTER — Encounter: Payer: Self-pay | Admitting: Physician Assistant

## 2020-01-18 DIAGNOSIS — R7303 Prediabetes: Secondary | ICD-10-CM | POA: Insufficient documentation

## 2020-01-18 LAB — COMPLETE METABOLIC PANEL WITH GFR
AG Ratio: 1.5 (calc) (ref 1.0–2.5)
ALT: 29 U/L (ref 9–46)
AST: 24 U/L (ref 10–35)
Albumin: 4.4 g/dL (ref 3.6–5.1)
Alkaline phosphatase (APISO): 95 U/L (ref 35–144)
BUN: 15 mg/dL (ref 7–25)
CO2: 29 mmol/L (ref 20–32)
Calcium: 9.8 mg/dL (ref 8.6–10.3)
Chloride: 101 mmol/L (ref 98–110)
Creat: 0.96 mg/dL (ref 0.70–1.25)
GFR, Est African American: 99 mL/min/{1.73_m2} (ref >=60)
GFR, Est Non African American: 86 mL/min/{1.73_m2} (ref >=60)
Globulin: 3 g/dL (calc) (ref 1.9–3.7)
Glucose, Bld: 106 mg/dL — ABNORMAL HIGH (ref 65–99)
Potassium: 4.1 mmol/L (ref 3.5–5.3)
Sodium: 139 mmol/L (ref 135–146)
Total Bilirubin: 0.7 mg/dL (ref 0.2–1.2)
Total Protein: 7.4 g/dL (ref 6.1–8.1)

## 2020-01-18 LAB — CBC
HCT: 49.3 % (ref 38.5–50.0)
Hemoglobin: 17.1 g/dL (ref 13.2–17.1)
MCH: 30.2 pg (ref 27.0–33.0)
MCHC: 34.7 g/dL (ref 32.0–36.0)
MCV: 87.1 fL (ref 80.0–100.0)
MPV: 9.5 fL (ref 7.5–12.5)
Platelets: 251 10*3/uL (ref 140–400)
RBC: 5.66 10*6/uL (ref 4.20–5.80)
RDW: 13.7 % (ref 11.0–15.0)
WBC: 5.4 10*3/uL (ref 3.8–10.8)

## 2020-01-18 LAB — LIPID PANEL W/REFLEX DIRECT LDL
Cholesterol: 154 mg/dL (ref <200)
HDL: 42 mg/dL (ref >=40)
LDL Cholesterol (Calc): 98 mg/dL (calc)
Non-HDL Cholesterol (Calc): 112 mg/dL (calc) (ref <130)
Total CHOL/HDL Ratio: 3.7 (calc) (ref <5.0)
Triglycerides: 57 mg/dL (ref <150)

## 2020-01-18 LAB — HEMOGLOBIN A1C W/OUT EAG: Hgb A1c MFr Bld: 6 % of total Hgb — ABNORMAL HIGH (ref <5.7)

## 2020-01-18 NOTE — Progress Notes (Signed)
Call pt: A1C is slightly elevated at 6.0. this is pre-diabetes. Watch carbs and sugars in diet and limit them. That is pasta, rice, potatoes, sweets, sodas, juices. Recheck A1C which is an average over 3 months in 3 months and lets see how you are doing with diet changes.

## 2020-02-01 ENCOUNTER — Encounter: Payer: Self-pay | Admitting: Sports Medicine

## 2020-02-01 ENCOUNTER — Ambulatory Visit (INDEPENDENT_AMBULATORY_CARE_PROVIDER_SITE_OTHER): Payer: BC Managed Care – PPO | Admitting: Sports Medicine

## 2020-02-01 ENCOUNTER — Other Ambulatory Visit: Payer: Self-pay

## 2020-02-01 DIAGNOSIS — M170 Bilateral primary osteoarthritis of knee: Secondary | ICD-10-CM | POA: Diagnosis not present

## 2020-02-01 DIAGNOSIS — G8929 Other chronic pain: Secondary | ICD-10-CM

## 2020-02-01 DIAGNOSIS — M25561 Pain in right knee: Secondary | ICD-10-CM

## 2020-02-01 DIAGNOSIS — M25562 Pain in left knee: Secondary | ICD-10-CM | POA: Diagnosis not present

## 2020-02-01 MED ORDER — MELOXICAM 15 MG PO TABS: 15.0000 mg | ORAL_TABLET | Freq: Every day | ORAL | 3 refills | Status: DC

## 2020-02-01 NOTE — Progress Notes (Signed)
    Procedures performed today:    None.  Independent interpretation of notes and tests performed by another provider:   I personally reviewed his knee x-rays which show end-stage bilateral knee osteoarthritis, tricompartmental with bone-on-bone appearance.  Brief History, Exam, Impression, and Recommendations:    Bilateral primary osteoarthritis of knee Joel Johnston is referred to me by Lesly Rubenstein, he is a pleasant 59 year old male with bilateral knee osteoarthritis. On exam today he does have an effusion on the right which is not symptomatic, no joint line pain. She started him on meloxicam appropriately and he has responded very well, he is for the most part pain-free, happy with how things are going, I am going to refill this. I have advised him to start some rehab exercises at home, he will ice his knees as needed, and if needed we can do arthrocentesis with injection. Return to see me as needed for this.    ___________________________________________ Joel Johnston. Joel Johnston, M.D., ABFM., CAQSM. Primary Care and Sports Medicine [facility]  Adjunct Instructor of Family Medicine  [employer]

## 2020-02-01 NOTE — Assessment & Plan Note (Addendum)
Joel Johnston is referred to me by Lesly Rubenstein, he is a pleasant 59 year old male with bilateral knee osteoarthritis. On exam today he does have an effusion on the right which is not symptomatic, no joint line pain. She started him on meloxicam appropriately and he has responded very well, he is for the most part pain-free, happy with how things are going, I am going to refill this. I have advised him to start some rehab exercises at home, he will ice his knees as needed, and if needed we can do arthrocentesis with injection. Return to see me as needed for this.

## 2020-10-25 ENCOUNTER — Other Ambulatory Visit: Payer: Self-pay | Admitting: Physician Assistant

## 2020-10-25 DIAGNOSIS — I10 Essential (primary) hypertension: Secondary | ICD-10-CM

## 2021-01-28 ENCOUNTER — Other Ambulatory Visit: Payer: Self-pay | Admitting: Physician Assistant

## 2021-01-28 DIAGNOSIS — I10 Essential (primary) hypertension: Secondary | ICD-10-CM

## 2021-01-29 ENCOUNTER — Other Ambulatory Visit: Payer: Self-pay | Admitting: Physician Assistant

## 2021-01-30 ENCOUNTER — Other Ambulatory Visit: Payer: Self-pay

## 2021-01-30 ENCOUNTER — Ambulatory Visit (INDEPENDENT_AMBULATORY_CARE_PROVIDER_SITE_OTHER): Payer: BC Managed Care – PPO | Admitting: Physician Assistant

## 2021-01-30 VITALS — BP 133/84 | HR 84 | Ht 67.0 in | Wt 172.0 lb

## 2021-01-30 DIAGNOSIS — Z23 Encounter for immunization: Secondary | ICD-10-CM

## 2021-01-30 DIAGNOSIS — M25561 Pain in right knee: Secondary | ICD-10-CM

## 2021-01-30 DIAGNOSIS — I10 Essential (primary) hypertension: Secondary | ICD-10-CM | POA: Diagnosis not present

## 2021-01-30 DIAGNOSIS — M170 Bilateral primary osteoarthritis of knee: Secondary | ICD-10-CM

## 2021-01-30 DIAGNOSIS — R7303 Prediabetes: Secondary | ICD-10-CM | POA: Diagnosis not present

## 2021-01-30 DIAGNOSIS — Z1329 Encounter for screening for other suspected endocrine disorder: Secondary | ICD-10-CM

## 2021-01-30 DIAGNOSIS — Z0000 Encounter for general adult medical examination without abnormal findings: Secondary | ICD-10-CM | POA: Diagnosis not present

## 2021-01-30 DIAGNOSIS — G8929 Other chronic pain: Secondary | ICD-10-CM

## 2021-01-30 DIAGNOSIS — Z13220 Encounter for screening for lipoid disorders: Secondary | ICD-10-CM

## 2021-01-30 DIAGNOSIS — Z131 Encounter for screening for diabetes mellitus: Secondary | ICD-10-CM | POA: Diagnosis not present

## 2021-01-30 DIAGNOSIS — Z125 Encounter for screening for malignant neoplasm of prostate: Secondary | ICD-10-CM

## 2021-01-30 DIAGNOSIS — M25562 Pain in left knee: Secondary | ICD-10-CM

## 2021-01-30 DIAGNOSIS — N529 Male erectile dysfunction, unspecified: Secondary | ICD-10-CM

## 2021-01-30 MED ORDER — LOSARTAN POTASSIUM-HCTZ 100-25 MG PO TABS: 1.0000 | ORAL_TABLET | Freq: Every day | ORAL | 1 refills | Status: DC

## 2021-01-30 MED ORDER — MELOXICAM 15 MG PO TABS: 15.0000 mg | ORAL_TABLET | Freq: Every day | ORAL | 1 refills | Status: DC

## 2021-01-30 MED ORDER — SILDENAFIL CITRATE 100 MG PO TABS: 50.0000 mg | ORAL_TABLET | Freq: Every day | ORAL | 11 refills | Status: DC | PRN

## 2021-01-30 MED ORDER — AMLODIPINE BESYLATE 5 MG PO TABS: 1.0000 | ORAL_TABLET | Freq: Every day | ORAL | 1 refills | Status: DC

## 2021-01-30 NOTE — Progress Notes (Signed)
Subjective:    Patient ID: Joel Johnston, male    DOB: 04-15-1961, 60 y.o.   MRN: [id number]  HPI Pt is a 60 yo male with HTN, bilateral OA of knees, pre-diabetes, ED who presents to the clinic for medication refill and annual CPE.   Pt is taking his medication. He denies any CP, palpitations, headaches or vision changes. Not checking BP at home.   He does need refill on mobic for bilateral knee pain.   He does request medication for ED to use as needed.   His wife was dx with dementia. He will be her primary caregiver.   .. Active Ambulatory Problems    Diagnosis Date Noted  . Essential hypertension, benign 02/21/2011  . Asymptomatic hypertensive urgency 07/14/2016  . Chronic pain of left knee 12/01/2018  . Bilateral primary osteoarthritis of knee 01/17/2020  . Prediabetes 01/18/2020  . Erectile dysfunction 01/30/2021   Resolved Ambulatory Problems    Diagnosis Date Noted  . No Resolved Ambulatory Problems   Past Medical History:  Diagnosis Date  . Hyperplastic polyps of stomach   . Hypertension       Review of Systems  All other systems reviewed and are negative.      Objective:   Physical Exam BP 133/84   Pulse 84   Ht 5\' 7"  (1.702 m)   Wt 172 lb (78 kg)   SpO2 96%   BMI 26.94 kg/m   General Appearance:    Alert, cooperative, no distress, appears stated age  Head:    Normocephalic, without obvious abnormality, atraumatic  Eyes:    PERRL, conjunctiva/corneas clear, EOM's intact, fundi    benign, both eyes       Ears:    Normal TM's and external ear canals, both ears  Nose:   Nares normal, septum midline, mucosa normal, no drainage    or sinus tenderness  Throat:   Lips, mucosa, and tongue normal; teeth and gums normal  Neck:   Supple, symmetrical, trachea midline, no adenopathy;       thyroid:  No enlargement/tenderness/nodules; no carotid   bruit or JVD  Back:     Symmetric, no curvature, ROM normal, no CVA tenderness  Lungs:     Clear to auscultation  bilaterally, respirations unlabored  Chest wall:    No tenderness or deformity  Heart:    Regular rate and rhythm, S1 and S2 normal, no murmur, rub   or gallop  Abdomen:     Soft, non-tender, bowel sounds active all four quadrants,    no masses, no organomegaly        Extremities:   Extremities normal, atraumatic, no cyanosis or edema  Pulses:   2+ and symmetric all extremities  Skin:   Skin color, texture, turgor normal, no rashes or lesions  Lymph nodes:   Cervical, supraclavicular, and axillary nodes normal  Neurologic:   CNII-XII intact. Normal strength, sensation and reflexes      throughout   . Depression screen [facility] 2/9 01/30/2021 01/16/2020 12/01/2018 08/17/2017 01/02/2017  Decreased Interest 0 0 0 0 0  Down, Depressed, Hopeless 0 0 0 0 0  PHQ - 2 Score 0 0 0 0 0  Altered sleeping - 0 0 - -  Tired, decreased energy - 0 0 - -  Change in appetite - 0 0 - -  Feeling bad or failure about yourself  - 0 - - -  Trouble concentrating - 0 0 - -  Moving slowly or fidgety/restless -  0 0 - -  Suicidal thoughts - 0 0 - -  PHQ-9 Score - 0 0 - -  Difficult doing work/chores - Not difficult at all Not difficult at all - -   .Marland Kitchen GAD 7 : Generalized Anxiety Score 01/16/2020  Nervous, Anxious, on Edge 0  Control/stop worrying 0  Worry too much - different things 0  Trouble relaxing 0  Restless 0  Easily annoyed or irritable 0  Afraid - awful might happen 0  Total GAD 7 Score 0  Anxiety Difficulty Not difficult at all    .Marland Kitchen  Androgen Deficiency in the Aging Male    Row Name 01/30/21 0900         Androgen Deficiency in the Aging Male   Do you have a decrease in libido (sex drive) Yes     Do you have lack of energy No     Do you have a decrease in strength and/or endurance No     Have you lost height No     Have you noticed a decreased "enjoyment of life" Yes     Are you sad and/or grumpy No     Are your erections less strong Yes     Have you noticed a recent deterioration in your  ability to play sports No     Are you falling asleep after dinner No     Has there been a recent deterioration in your work performance No           ..  SHIM    Row Name 01/30/21 0920         SHIM: Over the last 6 months:   How do you rate your confidence that you could get and keep an erection? Very Low     When you had erections with sexual stimulation, how often were your erections hard enough for penetration (entering your partner)? Almost Never or Never     During sexual intercourse, how often were you able to maintain your erection after you had penetrated (entered) your partner? Almost Never or Never     During sexual intercourse, how difficult was it to maintain your erection to completion of intercourse? Very Difficult     When you attempted sexual intercourse, how often was it satisfactory for you? A Few Times (much less than half the time)           SHIM Total Score   SHIM 7                  Assessment & Plan:  Marland KitchenMarland KitchenSammy was seen today for annual exam.  Diagnoses and all orders for this visit:  Routine physical examination -     CBC with Differential/Platelet -     COMPLETE METABOLIC PANEL WITH GFR -     Lipid Panel w/reflex Direct LDL -     TSH -     PSA  Screening for lipid disorders -     Lipid Panel w/reflex Direct LDL  Screening for diabetes mellitus -     COMPLETE METABOLIC PANEL WITH GFR  Essential hypertension, benign -     COMPLETE METABOLIC PANEL WITH GFR -     losartan-hydrochlorothiazide (HYZAAR) 100-25 MG tablet; Take 1 tablet by mouth daily. -     amLODipine (NORVASC) 5 MG tablet; Take 1 tablet (5 mg total) by mouth daily.  Chronic pain of both knees -     meloxicam (MOBIC) 15 MG tablet; Take 1 tablet (15 mg  total) by mouth daily.  Bilateral primary osteoarthritis of knee -     meloxicam (MOBIC) 15 MG tablet; Take 1 tablet (15 mg total) by mouth daily.  Screening PSA (prostate specific antigen) -     PSA  Thyroid disorder  screen -     TSH  Erectile dysfunction, unspecified erectile dysfunction type -     sildenafil (VIAGRA) 100 MG tablet; Take 0.5-1 tablets (50-100 mg total) by mouth daily as needed for erectile dysfunction.  Pre-diabetes -     Hemoglobin A1c  Need for shingles vaccine -     Varicella-zoster vaccine IM    .Start a regular exercise program and make sure you are eating a healthy diet Try to eat 4 servings of dairy a day or take a calcium supplement (500mg  twice a day). Fasting labs ordered.  PSA ordered. PHQ/GAD normal. Colonoscopy UTD.  covid/flu vaccine UTD.  Needs shingrix.  viagra started as needed for ED.  BP looks good.  Follow up in 6 months.

## 2021-01-30 NOTE — Patient Instructions (Signed)

## 2021-01-31 LAB — COMPLETE METABOLIC PANEL WITH GFR
AG Ratio: 1.8 (calc) (ref 1.0–2.5)
ALT: 18 U/L (ref 9–46)
AST: 19 U/L (ref 10–35)
Albumin: 4.7 g/dL (ref 3.6–5.1)
Alkaline phosphatase (APISO): 107 U/L (ref 35–144)
BUN: 14 mg/dL (ref 7–25)
CO2: 28 mmol/L (ref 20–32)
Calcium: 9.7 mg/dL (ref 8.6–10.3)
Chloride: 103 mmol/L (ref 98–110)
Creat: 0.84 mg/dL (ref 0.70–1.25)
GFR, Est African American: 110 mL/min/{1.73_m2} (ref >=60)
GFR, Est Non African American: 95 mL/min/{1.73_m2} (ref >=60)
Globulin: 2.6 g/dL (calc) (ref 1.9–3.7)
Glucose, Bld: 101 mg/dL — ABNORMAL HIGH (ref 65–99)
Potassium: 4.1 mmol/L (ref 3.5–5.3)
Sodium: 141 mmol/L (ref 135–146)
Total Bilirubin: 0.6 mg/dL (ref 0.2–1.2)
Total Protein: 7.3 g/dL (ref 6.1–8.1)

## 2021-01-31 LAB — CBC WITH DIFFERENTIAL/PLATELET
Absolute Monocytes: 622 cells/uL (ref 200–950)
Basophils Absolute: 43 cells/uL (ref 0–200)
Basophils Relative: 0.7 %
Eosinophils Absolute: 128 cells/uL (ref 15–500)
Eosinophils Relative: 2.1 %
HCT: 50.5 % — ABNORMAL HIGH (ref 38.5–50.0)
Hemoglobin: 16.7 g/dL (ref 13.2–17.1)
Lymphs Abs: 1604 cells/uL (ref 850–3900)
MCH: 29.4 pg (ref 27.0–33.0)
MCHC: 33.1 g/dL (ref 32.0–36.0)
MCV: 88.9 fL (ref 80.0–100.0)
MPV: 9.9 fL (ref 7.5–12.5)
Monocytes Relative: 10.2 %
Neutro Abs: 3703 cells/uL (ref 1500–7800)
Neutrophils Relative %: 60.7 %
Platelets: 228 10*3/uL (ref 140–400)
RBC: 5.68 10*6/uL (ref 4.20–5.80)
RDW: 13.2 % (ref 11.0–15.0)
Total Lymphocyte: 26.3 %
WBC: 6.1 10*3/uL (ref 3.8–10.8)

## 2021-01-31 LAB — PSA: PSA: 1.63 ng/mL (ref <=4.0)

## 2021-01-31 LAB — LIPID PANEL W/REFLEX DIRECT LDL
Cholesterol: 147 mg/dL (ref <200)
HDL: 48 mg/dL (ref >=40)
LDL Cholesterol (Calc): 86 mg/dL (calc)
Non-HDL Cholesterol (Calc): 99 mg/dL (calc) (ref <130)
Total CHOL/HDL Ratio: 3.1 (calc) (ref <5.0)
Triglycerides: 44 mg/dL (ref <150)

## 2021-01-31 LAB — TSH: TSH: 1.26 mIU/L (ref 0.40–4.50)

## 2021-01-31 LAB — HEMOGLOBIN A1C
Hgb A1c MFr Bld: 5.8 % of total Hgb — ABNORMAL HIGH (ref <5.7)
Mean Plasma Glucose: 120 mg/dL
eAG (mmol/L): 6.6 mmol/L

## 2021-01-31 NOTE — Progress Notes (Signed)
Joel Johnston,   A1C is 5.8. still in prediabetes range.  Thyroid looks great.  Kidney and liver look wonderful.  Cholesterol looks GREAT.

## 2021-02-04 ENCOUNTER — Encounter: Payer: Self-pay | Admitting: Physician Assistant

## 2021-02-08 DIAGNOSIS — H524 Presbyopia: Secondary | ICD-10-CM | POA: Diagnosis not present

## 2021-02-08 DIAGNOSIS — H5203 Hypermetropia, bilateral: Secondary | ICD-10-CM | POA: Diagnosis not present

## 2021-02-08 DIAGNOSIS — Z135 Encounter for screening for eye and ear disorders: Secondary | ICD-10-CM | POA: Diagnosis not present

## 2021-02-08 DIAGNOSIS — H527 Unspecified disorder of refraction: Secondary | ICD-10-CM | POA: Diagnosis not present

## 2021-07-23 ENCOUNTER — Other Ambulatory Visit: Payer: Self-pay | Admitting: Physician Assistant

## 2021-07-23 DIAGNOSIS — M25562 Pain in left knee: Secondary | ICD-10-CM

## 2021-07-23 DIAGNOSIS — I10 Essential (primary) hypertension: Secondary | ICD-10-CM

## 2021-07-23 DIAGNOSIS — M170 Bilateral primary osteoarthritis of knee: Secondary | ICD-10-CM

## 2021-07-23 DIAGNOSIS — G8929 Other chronic pain: Secondary | ICD-10-CM

## 2021-08-20 ENCOUNTER — Telehealth: Payer: Self-pay

## 2021-08-20 NOTE — Telephone Encounter (Signed)
LVM for patient to call back to get this appt scheduled. AM 

## 2021-08-20 NOTE — Telephone Encounter (Signed)
Patient lvm concerning scheduling 2nd Shingles shot.   Please call to schedule nurse visit.   Thanks

## 2021-08-21 ENCOUNTER — Other Ambulatory Visit: Payer: Self-pay

## 2021-08-21 ENCOUNTER — Ambulatory Visit (INDEPENDENT_AMBULATORY_CARE_PROVIDER_SITE_OTHER): Payer: BC Managed Care – PPO | Admitting: Physician Assistant

## 2021-08-21 VITALS — BP 145/80 | HR 82 | Temp 98.2°F | Wt 176.1 lb

## 2021-08-21 DIAGNOSIS — Z23 Encounter for immunization: Secondary | ICD-10-CM

## 2021-08-21 NOTE — Progress Notes (Signed)
Patient was here for Shingles #2 and Flu vaccine. Patient tolerated injections well without any complications on the Left Deltoid (Flu) and Right Deltoid (Shingles). Patient opt out to get Tdap vaccine. Patient informed to schedule next appointment as directed.

## 2021-08-26 ENCOUNTER — Other Ambulatory Visit: Payer: Self-pay | Admitting: Physician Assistant

## 2021-08-26 DIAGNOSIS — I10 Essential (primary) hypertension: Secondary | ICD-10-CM

## 2021-08-26 DIAGNOSIS — G8929 Other chronic pain: Secondary | ICD-10-CM

## 2021-08-26 DIAGNOSIS — M25562 Pain in left knee: Secondary | ICD-10-CM

## 2021-08-26 DIAGNOSIS — M170 Bilateral primary osteoarthritis of knee: Secondary | ICD-10-CM

## 2021-09-25 ENCOUNTER — Other Ambulatory Visit: Payer: Self-pay | Admitting: Physician Assistant

## 2021-09-25 DIAGNOSIS — M170 Bilateral primary osteoarthritis of knee: Secondary | ICD-10-CM

## 2021-09-25 DIAGNOSIS — M25561 Pain in right knee: Secondary | ICD-10-CM

## 2021-09-25 DIAGNOSIS — G8929 Other chronic pain: Secondary | ICD-10-CM

## 2021-09-25 DIAGNOSIS — I10 Essential (primary) hypertension: Secondary | ICD-10-CM

## 2021-09-25 NOTE — Telephone Encounter (Signed)
Please call pt to schedule appt.  No further refills until pt is seen.  T. Tyheem Boughner, CMA  

## 2021-09-25 NOTE — Telephone Encounter (Signed)
Patient has been scheduled for Monday, 09/30/21, with Jade.

## 2021-09-30 ENCOUNTER — Ambulatory Visit (INDEPENDENT_AMBULATORY_CARE_PROVIDER_SITE_OTHER): Payer: Self-pay | Admitting: Physician Assistant

## 2021-09-30 DIAGNOSIS — Z91199 Patient's noncompliance with other medical treatment and regimen due to unspecified reason: Secondary | ICD-10-CM

## 2021-09-30 NOTE — Progress Notes (Signed)
No show

## 2021-10-20 ENCOUNTER — Other Ambulatory Visit: Payer: Self-pay | Admitting: Physician Assistant

## 2021-10-20 DIAGNOSIS — I10 Essential (primary) hypertension: Secondary | ICD-10-CM

## 2021-10-25 ENCOUNTER — Encounter: Payer: Self-pay | Admitting: Physician Assistant

## 2021-10-25 ENCOUNTER — Ambulatory Visit (INDEPENDENT_AMBULATORY_CARE_PROVIDER_SITE_OTHER): Payer: BC Managed Care – PPO | Admitting: Physician Assistant

## 2021-10-25 ENCOUNTER — Other Ambulatory Visit: Payer: Self-pay

## 2021-10-25 VITALS — BP 144/97 | HR 86 | Ht 67.0 in | Wt 181.0 lb

## 2021-10-25 DIAGNOSIS — I10 Essential (primary) hypertension: Secondary | ICD-10-CM

## 2021-10-25 DIAGNOSIS — Z23 Encounter for immunization: Secondary | ICD-10-CM

## 2021-10-25 DIAGNOSIS — M170 Bilateral primary osteoarthritis of knee: Secondary | ICD-10-CM

## 2021-10-25 MED ORDER — AMLODIPINE BESYLATE 5 MG PO TABS: 5.0000 mg | ORAL_TABLET | Freq: Every day | ORAL | 1 refills | Status: DC

## 2021-10-25 MED ORDER — CELECOXIB 200 MG PO CAPS: ORAL_CAPSULE | ORAL | 1 refills | Status: DC

## 2021-10-25 MED ORDER — DICLOFENAC SODIUM 1 % EX GEL: 4.0000 g | Freq: Four times a day (QID) | CUTANEOUS | 1 refills | Status: DC

## 2021-10-25 MED ORDER — LOSARTAN POTASSIUM-HCTZ 100-25 MG PO TABS: ORAL_TABLET | ORAL | 1 refills | Status: DC

## 2021-10-25 NOTE — Progress Notes (Signed)
° °  Subjective:    Patient ID: Joel Johnston, male    DOB: 1961/04/09, 60 y.o.   MRN: [id number]  HPI Pt is a 60 yo male with HTN who presents to the clinic for 6 month follow up.   He did not take his medications today. He denies any CP, palpitations, headaches, or vision changes.   He continues to have bilateral knee pain from OA. Mobic is not helping as much as it had before.   .. Active Ambulatory Problems    Diagnosis Date Noted   Essential hypertension, benign 02/21/2011   Asymptomatic hypertensive urgency 07/14/2016   Chronic pain of left knee 12/01/2018   Bilateral primary osteoarthritis of knee 01/17/2020   Prediabetes 01/18/2020   Erectile dysfunction 01/30/2021   Resolved Ambulatory Problems    Diagnosis Date Noted   No Resolved Ambulatory Problems   Past Medical History:  Diagnosis Date   Hyperplastic polyps of stomach    Hypertension        Review of Systems  All other systems reviewed and are negative.     Objective:   Physical Exam Vitals reviewed.  Constitutional:      Appearance: Normal appearance.  HENT:     Head: Normocephalic.  Neck:     Vascular: No carotid bruit.  Cardiovascular:     Rate and Rhythm: Normal rate and regular rhythm.     Pulses: Normal pulses.     Heart sounds: Normal heart sounds.  Pulmonary:     Effort: Pulmonary effort is normal.     Breath sounds: Normal breath sounds.  Neurological:     General: No focal deficit present.     Mental Status: He is alert.  Psychiatric:        Mood and Affect: Mood normal.          Assessment & Plan:  Marland KitchenMarland KitchenSammy was seen today for follow-up.  Diagnoses and all orders for this visit:  Essential hypertension, benign -     COMPLETE METABOLIC PANEL WITH GFR -     amLODipine (NORVASC) 5 MG tablet; Take 1 tablet (5 mg total) by mouth daily. -     losartan-hydrochlorothiazide (HYZAAR) 100-25 MG tablet; TAKE 1 TABLET BY MOUTH DAILY  Need for Tdap vaccination -     Tdap vaccine greater  than or equal to 7yo IM  Bilateral primary osteoarthritis of knee -     celecoxib (CELEBREX) 200 MG capsule; Take one tablet twice a day for joint pain. -     diclofenac Sodium (VOLTAREN) 1 % GEL; Apply 4 g topically 4 (four) times daily. To affected joint.  BP not to goal but close and did not take BP medication this morning Refilled meds Cmp ordered Follow up in 6 months  OA needs to follow up with Dr. Karie Schwalbe to consider injections Stop mobic Start celebrex and as needed diclofenac gel  Tdap given today. Flu and covid shot UTD.

## 2021-10-26 LAB — COMPLETE METABOLIC PANEL WITH GFR
AG Ratio: 1.6 (calc) (ref 1.0–2.5)
ALT: 18 U/L (ref 9–46)
AST: 17 U/L (ref 10–35)
Albumin: 4.3 g/dL (ref 3.6–5.1)
Alkaline phosphatase (APISO): 120 U/L (ref 35–144)
BUN: 11 mg/dL (ref 7–25)
CO2: 24 mmol/L (ref 20–32)
Calcium: 9.6 mg/dL (ref 8.6–10.3)
Chloride: 102 mmol/L (ref 98–110)
Creat: 0.8 mg/dL (ref 0.70–1.35)
Globulin: 2.7 g/dL (calc) (ref 1.9–3.7)
Glucose, Bld: 106 mg/dL — ABNORMAL HIGH (ref 65–99)
Potassium: 4 mmol/L (ref 3.5–5.3)
Sodium: 140 mmol/L (ref 135–146)
Total Bilirubin: 0.4 mg/dL (ref 0.2–1.2)
Total Protein: 7 g/dL (ref 6.1–8.1)
eGFR: 100 mL/min/{1.73_m2} (ref >=60)

## 2021-10-29 NOTE — Progress Notes (Signed)
Kidney and liver look great. Pt was not fasting so the glucose looks fine for random.

## 2021-11-01 ENCOUNTER — Ambulatory Visit (INDEPENDENT_AMBULATORY_CARE_PROVIDER_SITE_OTHER): Payer: BC Managed Care – PPO

## 2021-11-01 ENCOUNTER — Ambulatory Visit: Payer: BC Managed Care – PPO | Admitting: Sports Medicine

## 2021-11-01 ENCOUNTER — Other Ambulatory Visit: Payer: Self-pay

## 2021-11-01 DIAGNOSIS — M170 Bilateral primary osteoarthritis of knee: Secondary | ICD-10-CM

## 2021-11-01 DIAGNOSIS — M1712 Unilateral primary osteoarthritis, left knee: Secondary | ICD-10-CM | POA: Diagnosis not present

## 2021-11-01 DIAGNOSIS — M1711 Unilateral primary osteoarthritis, right knee: Secondary | ICD-10-CM | POA: Diagnosis not present

## 2021-11-01 NOTE — Progress Notes (Signed)
° ° °  Procedures performed today:    Procedure: Real-time Ultrasound Guided injection of the left knee Device: Samsung HS60  Verbal informed consent obtained.  Time-out conducted.  Noted no overlying erythema, induration, or other signs of local infection.  Skin prepped in a sterile fashion.  Local anesthesia: Topical Ethyl chloride.  With sterile technique and under real time ultrasound guidance: Trace effusion noted, 1 cc Kenalog 40, 2 cc lidocaine, 2 cc bupivacaine injected easily Completed without difficulty  Advised to call if fevers/chills, erythema, induration, drainage, or persistent bleeding.  Images permanently stored and available for review in PACS.  Impression: Technically successful ultrasound guided injection.  Procedure: Real-time Ultrasound Guided injection of the right knee Device: Samsung HS60  Verbal informed consent obtained.  Time-out conducted.  Noted no overlying erythema, induration, or other signs of local infection.  Skin prepped in a sterile fashion.  Local anesthesia: Topical Ethyl chloride.  With sterile technique and under real time ultrasound guidance: Trace effusion noted, 1 cc Kenalog 40, 2 cc lidocaine, 2 cc bupivacaine injected easily Completed without difficulty  Advised to call if fevers/chills, erythema, induration, drainage, or persistent bleeding.  Images permanently stored and available for review in PACS.  Impression: Technically successful ultrasound guided injection.   Independent interpretation of notes and tests performed by another provider:   None.  Brief History, Exam, Impression, and Recommendations:    Bilateral primary osteoarthritis of knee This is a very pleasant 61 year old male with bilateral knee osteoarthritis, he initially did well with meloxicam, ultimately this became ineffective so he was switched to Celebrex which helped a lot, unfortunately he is having increasing pain, both knees, mild swelling. We injected both  knees today per his request, return to see me in 6 weeks. I would like updated x-rays today.    ___________________________________________ Ihor Austin. Benjamin Stain, M.D., ABFM., CAQSM. Primary Care and Sports Medicine [facility]  Adjunct Instructor of Family Medicine  [employer]

## 2021-11-01 NOTE — Assessment & Plan Note (Signed)
This is a very pleasant 61 year old male with bilateral knee osteoarthritis, he initially did well with meloxicam, ultimately this became ineffective so he was switched to Celebrex which helped a lot, unfortunately he is having increasing pain, both knees, mild swelling. We injected both knees today per his request, return to see me in 6 weeks. I would like updated x-rays today.

## 2021-12-13 ENCOUNTER — Ambulatory Visit (INDEPENDENT_AMBULATORY_CARE_PROVIDER_SITE_OTHER): Payer: BC Managed Care – PPO | Admitting: Sports Medicine

## 2021-12-13 ENCOUNTER — Other Ambulatory Visit: Payer: Self-pay

## 2021-12-13 ENCOUNTER — Encounter: Payer: Self-pay | Admitting: Sports Medicine

## 2021-12-13 ENCOUNTER — Ambulatory Visit (INDEPENDENT_AMBULATORY_CARE_PROVIDER_SITE_OTHER): Payer: BC Managed Care – PPO

## 2021-12-13 DIAGNOSIS — M170 Bilateral primary osteoarthritis of knee: Secondary | ICD-10-CM | POA: Diagnosis not present

## 2021-12-13 DIAGNOSIS — M7541 Impingement syndrome of right shoulder: Secondary | ICD-10-CM

## 2021-12-13 DIAGNOSIS — G8929 Other chronic pain: Secondary | ICD-10-CM | POA: Diagnosis not present

## 2021-12-13 DIAGNOSIS — M25551 Pain in right hip: Secondary | ICD-10-CM | POA: Diagnosis not present

## 2021-12-13 DIAGNOSIS — M25511 Pain in right shoulder: Secondary | ICD-10-CM | POA: Diagnosis not present

## 2021-12-13 NOTE — Assessment & Plan Note (Signed)
End-stage bone-on-bone knee osteoarthritis on x-rays, doing a lot better after the injection at the last visit. Celebrex also very effective, return as needed.

## 2021-12-13 NOTE — Assessment & Plan Note (Signed)
Sam is also complaining of pain right anterior groin, good motion, minimal pain with internal rotation suggestive of mild hip degenerative changes, adding x-rays, formal therapy. Return to see me in a month.

## 2021-12-13 NOTE — Assessment & Plan Note (Signed)
Tank is a very pleasant 61 year old male, he has noted months of pain right shoulder over the deltoid worse with abduction, overhead activities. On exam he has a positive Neer's, Hawkins, empty can signs, reproduction of pain with resisted external rotation and a positive liftoff sign. Suspect rotator cuff tendinitis, he does have some weakness to abduction so I do suspect there is a tear as well. Adding x-rays, formal physical therapy, return to see me in 6 weeks, injection if no better.

## 2021-12-13 NOTE — Progress Notes (Signed)
° ° °  Procedures performed today:    None.  Independent interpretation of notes and tests performed by another provider:   None.  Brief History, Exam, Impression, and Recommendations:    Impingement syndrome, shoulder, right Yahya is a very pleasant 61 year old male, he has noted months of pain right shoulder over the deltoid worse with abduction, overhead activities. On exam he has a positive Neer's, Hawkins, empty can signs, reproduction of pain with resisted external rotation and a positive liftoff sign. Suspect rotator cuff tendinitis, he does have some weakness to abduction so I do suspect there is a tear as well. Adding x-rays, formal physical therapy, return to see me in 6 weeks, injection if no better.  Chronic pain of right hip Sam is also complaining of pain right anterior groin, good motion, minimal pain with internal rotation suggestive of mild hip degenerative changes, adding x-rays, formal therapy. Return to see me in a month.  Bilateral primary osteoarthritis of knee End-stage bone-on-bone knee osteoarthritis on x-rays, doing a lot better after the injection at the last visit. Celebrex also very effective, return as needed.    ___________________________________________ Ihor Austin. Benjamin Stain, M.D., ABFM., CAQSM. Primary Care and Sports Medicine [facility]  Adjunct Instructor of Family Medicine  [employer]

## 2021-12-16 ENCOUNTER — Other Ambulatory Visit: Payer: Self-pay | Admitting: Physician Assistant

## 2021-12-16 DIAGNOSIS — M170 Bilateral primary osteoarthritis of knee: Secondary | ICD-10-CM

## 2021-12-19 ENCOUNTER — Other Ambulatory Visit: Payer: Self-pay

## 2021-12-19 ENCOUNTER — Encounter: Payer: Self-pay | Admitting: Rehabilitative and Restorative Service Providers"

## 2021-12-19 ENCOUNTER — Ambulatory Visit: Payer: BC Managed Care – PPO | Attending: Sports Medicine | Admitting: Rehabilitative and Restorative Service Providers"

## 2021-12-19 DIAGNOSIS — M6281 Muscle weakness (generalized): Secondary | ICD-10-CM | POA: Diagnosis not present

## 2021-12-19 DIAGNOSIS — M7541 Impingement syndrome of right shoulder: Secondary | ICD-10-CM | POA: Insufficient documentation

## 2021-12-19 DIAGNOSIS — R293 Abnormal posture: Secondary | ICD-10-CM | POA: Diagnosis not present

## 2021-12-19 DIAGNOSIS — G8929 Other chronic pain: Secondary | ICD-10-CM | POA: Insufficient documentation

## 2021-12-19 DIAGNOSIS — M25511 Pain in right shoulder: Secondary | ICD-10-CM | POA: Diagnosis not present

## 2021-12-19 DIAGNOSIS — R29898 Other symptoms and signs involving the musculoskeletal system: Secondary | ICD-10-CM | POA: Insufficient documentation

## 2021-12-19 NOTE — Patient Instructions (Signed)
Access Code: [email or web address] URL: [email or web address] Date: 12/19/2021 Prepared by: Gillermo Murdoch  Exercises Seated Cervical Retraction - 2 x daily - 7 x weekly - 1-2 sets - 5-10 reps - 10 sec hold Seated Scapular Retraction - 2 x daily - 7 x weekly - 1-2 sets - 10 reps - 10 sec hold Shoulder External Rotation and Scapular Retraction - 3 x daily - 7 x weekly - 1 sets - 10 reps - 3-5 sec hold Shoulder External Rotation in 45 Degrees Abduction - 2 x daily - 7 x weekly - 1-2 sets - 10 reps - 30 sec hold Doorway Pec Stretch at 60 Degrees Abduction - 3 x daily - 7 x weekly - 1 sets - 3 reps Doorway Pec Stretch at 90 Degrees Abduction - 3 x daily - 7 x weekly - 1 sets - 3 reps - 30 seconds hold Doorway Pec Stretch at 120 Degrees Abduction - 3 x daily - 7 x weekly - 1 sets - 3 reps - 30 second hold hold Standing Pectoral Release with Shoulder Abduction with Ball at Wall - 2-3 x daily - 7 x weekly  Patient Education TENS Unit Trigger Point Dry Needling

## 2021-12-19 NOTE — Therapy (Signed)
[facility] [address]  [address] [place], [place], [address] Phone: [phone]   Fax:  [phone]  Physical Therapy Evaluation  Patient Details  Name: Joel Johnston MRN: [id number] Date of Birth: [birth year] Referring Provider (PT): Dr Benjamin Stain   Encounter Date: 12/19/2021   PT End of Session - 12/19/21 1752     Visit Number 1    Number of Visits 12    Date for PT Re-Evaluation 01/30/22    PT Start Time 1533    PT Stop Time 1624    PT Time Calculation (min) 51 min    Activity Tolerance Patient tolerated treatment well             Past Medical History:  Diagnosis Date   Hyperplastic polyps of stomach    Hypertension     History reviewed. No pertinent surgical history.  There were no vitals filed for this visit.    Subjective Assessment - 12/19/21 1539     Subjective Patient reports that he has had Rt shoulder pain for the past 18 months which he feels is due to the repetetive nature of his work. 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 he sometimes has pain in the Lt shoulder but not like the Rt.    Pertinent History HTN    Patient Stated Goals get rid of the shoulder pain so he can sleep without waking due to pain    Currently in Pain? Yes    Pain Score 6     Pain Location Shoulder    Pain Orientation Right    Pain Descriptors / Indicators Nagging;Aching    Pain Type Chronic pain    Pain Onset More than a month ago    Pain Frequency Intermittent    Aggravating Factors  work - reaching; lifting    Pain Relieving Factors heat                [facility] PT Assessment - 12/19/21 0001       Assessment   Medical Diagnosis Rt shoulder pain; bilat hip pain    Referring Provider (PT) Dr Benjamin Stain    Onset Date/Surgical Date 08/27/20    Hand Dominance Right    Next MD Visit 01/24/22    Prior Therapy none      Precautions   Precautions None      Restrictions   Weight  Bearing Restrictions No      Balance Screen   Has the patient fallen in the past 6 months No    Has the patient had a decrease in activity level because of a fear of falling?  No    Is the patient reluctant to leave their home because of a fear of falling?  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    Leisure yard work; things around [employer]; sleeping      Observation/Other Assessments   Focus on Therapeutic Outcomes (FOTO)  50      Posture/Postural Control   Posture Comments head forward; increased thoracic kyphosis; head of the humerus anterior in orientation; scapulae abducted and rotated  along the thoracic wall      AROM   Right Shoulder Extension 52 Degrees    Right Shoulder Flexion 130 Degrees   painful   Right Shoulder ABduction 138 Degrees   painful   Right Shoulder Internal Rotation --   thumb L1   Right Shoulder External Rotation 86 Degrees   painful   Left Shoulder Extension 45 Degrees    Left Shoulder Flexion 123 Degrees    Left Shoulder ABduction 130 Degrees    Left Shoulder Internal Rotation --   thumb T12   Left Shoulder External Rotation 90 Degrees      Strength   Overall Strength Comments posterior shoulder girdle 4/5 to 4+/5      Palpation   Spinal mobility hypomobile thoracic spine with PA and lateral mobs    Palpation comment muscular tightness through the pecs; upper trap; leveator; teres Rt > Lt                        Objective measurements completed on examination: See above findings.       [facility] Adult PT Treatment/Exercise - 12/19/21 0001       Therapeutic Activites    Other Therapeutic Activities myofacial ball release work Rt  shoudler girdle      Shoulder Exercises: Standing   Other Standing Exercises chin tuck 10 sec x 5 reps; scap squeeze 10 sec x 5; L's x 10 reps; W's x 10 reps with foam roller      Shoulder Exercises: Stretch   Other Shoulder Stretches doorway stretch 3 postions x 2 reps 20-30 sec hold to pt tolerance      Moist Heat Therapy   Number Minutes Moist Heat 10 Minutes    Moist Heat Location Shoulder      Electrical Stimulation   Electrical Stimulation Location Rt shoulder    Electrical Stimulation Action TENS    Electrical Stimulation Parameters to tolerance    Electrical Stimulation Goals Pain;Tone                          PT Long Term Goals - 12/19/21 1757       PT LONG TERM GOAL #1   Title Decrease Rt shoulder pain by 50-75% allowing patient to continue work and functional activities with minimal pain    Time 6    Period Weeks    Status New    Target Date 01/30/22      PT LONG TERM GOAL #2   Title Increase shoulder AROM by 5-7 degrees in elevation bilat    Time 6    Period Weeks    Status New    Target Date 01/30/22      PT LONG TERM GOAL #3   Title Improve posterior shoulder girdle strength to 5/5 with patient to demonstrate improved upright posture and alignment with posterior shoulder girdle engaged    Time 6    Period Weeks    Status New    Target Date 01/30/22      PT LONG TERM GOAL #4   Title Independent in HEP    Time 6    Period Weeks    Status New    Target Date 01/30/22      PT LONG TERM GOAL #5   Title Improve functional limitation score to 68    Time 6    Period Weeks    Status New    Target  Date 01/30/22                    Plan - 12/19/21 1752     Clinical Impression Statement Patient presents with ~ 18 month history of Rt shoulder pain which he feels is related to repetitive reaching at work. He has poor posture and alignment; limited shoulder ROM/mobility; decresaed posterior shoulder girdle strength; poor body  mechanics and scapular dyskinesis; pain with functional and work activities. Patient will benefit fro mPT to address problems identified.    Stability/Clinical Decision Making Stable/Uncomplicated    Clinical Decision Making Low    Rehab Potential Good    PT Frequency 2x / week    PT Duration 6 weeks    PT Treatment/Interventions ADLs/Self Care Home Management;Aquatic Therapy;Cryotherapy;Electrical Stimulation;Iontophoresis 4mg /ml Dexamethasone;Moist Heat;Ultrasound;Therapeutic activities;Therapeutic exercise;Neuromuscular re-education;Patient/family education;Manual techniques;Passive range of motion;Taping    PT Next Visit Plan review and progress HEP; trial of DN vs manual work to increase tissue extensibility through the anterior shoulder/pecs; posterior shoulder girdle strengthening; modalities as indicated    PT Home Exercise Plan [email or web address]    Consulted and Agree with Plan of Care Patient             Patient will benefit from skilled therapeutic intervention in order to improve the following deficits and impairments:  Decreased range of motion, Impaired UE functional use, Decreased activity tolerance, Pain, Impaired flexibility, Improper body mechanics, Decreased mobility, Decreased strength, Postural dysfunction  Visit Diagnosis: Chronic right shoulder pain  Other symptoms and signs involving the musculoskeletal system  Abnormal posture  Muscle weakness (generalized)     Problem List Patient Active Problem List   Diagnosis Date Noted   Impingement syndrome, shoulder, right 12/13/2021   Chronic pain of right hip 12/13/2021   Erectile dysfunction 01/30/2021   Prediabetes 01/18/2020   Bilateral primary osteoarthritis of knee 01/17/2020   Asymptomatic hypertensive urgency 07/14/2016   Essential hypertension, benign 02/21/2011    Joel Johnston 02/23/2011, PT, MPH  12/19/2021, 6:01 PM  [facility] [address] [place] [address]  [address] [place], [place], [place] Phone: [phone]   Fax:  [phone]  Name: Joel Johnston MRN: Latrelle Dodrill Date of Birth: 05/04/1961

## 2021-12-25 ENCOUNTER — Other Ambulatory Visit: Payer: Self-pay

## 2021-12-25 ENCOUNTER — Ambulatory Visit: Payer: BC Managed Care – PPO | Attending: Sports Medicine | Admitting: Rehabilitative and Restorative Service Providers"

## 2021-12-25 ENCOUNTER — Encounter: Payer: Self-pay | Admitting: Rehabilitative and Restorative Service Providers"

## 2021-12-25 DIAGNOSIS — R293 Abnormal posture: Secondary | ICD-10-CM | POA: Diagnosis present

## 2021-12-25 DIAGNOSIS — M25511 Pain in right shoulder: Secondary | ICD-10-CM | POA: Insufficient documentation

## 2021-12-25 DIAGNOSIS — G8929 Other chronic pain: Secondary | ICD-10-CM | POA: Insufficient documentation

## 2021-12-25 DIAGNOSIS — R29898 Other symptoms and signs involving the musculoskeletal system: Secondary | ICD-10-CM | POA: Diagnosis present

## 2021-12-25 DIAGNOSIS — M6281 Muscle weakness (generalized): Secondary | ICD-10-CM | POA: Diagnosis present

## 2021-12-25 NOTE — Patient Instructions (Addendum)
?  Access Code: [email or web address] ?URL: [email or web address] ?Date: 12/25/2021 ?Prepared by: Corlis Leak ? ?Exercises ?Seated Cervical Retraction - 2 x daily - 7 x weekly - 1-2 sets - 5-10 reps - 10 sec hold ?Seated Scapular Retraction - 2 x daily - 7 x weekly - 1-2 sets - 10 reps - 10 sec hold ?Shoulder External Rotation and Scapular Retraction - 3 x daily - 7 x weekly - 1 sets - 10 reps - 3-5 sec hold ?Shoulder External Rotation in 45 Degrees Abduction - 2 x daily - 7 x weekly - 1-2 sets - 10 reps - 30 sec hold ?Doorway Pec Stretch at 60 Degrees Abduction - 3 x daily - 7 x weekly - 1 sets - 3 reps ?Doorway Pec Stretch at 90 Degrees Abduction - 3 x daily - 7 x weekly - 1 sets - 3 reps - 30 seconds hold ?Doorway Pec Stretch at 120 Degrees Abduction - 3 x daily - 7 x weekly - 1 sets - 3 reps - 30 second hold hold ?Standing Pectoral Release with Shoulder Abduction with Ball at Wall - 2-3 x daily - 7 x weekly ?Standing Shoulder External Rotation with Resistance - 2 x daily - 7 x weekly - 1-3 sets - 10 reps - 2-3 sec hold ?Standing Bilateral Low Shoulder Row with Anchored Resistance - 2 x daily - 7 x weekly - 1-3 sets - 10 reps - 2-3 sec hold ?Bicep Stretch at Table - 2 x daily - 7 x weekly - 1 sets - 3 reps - 30 sec hold ?Supine Chest Stretch on Foam Roll - 2 x daily - 7 x weekly - 1 sets - 1 reps - 2-5 min sec hold ? ?

## 2021-12-25 NOTE — Therapy (Signed)
[place] [facility] ?[address] [place] [address] [place] [address] ?[place], [place], [address] ?Phone: [phone]   Fax:  [phone] ? ?Physical Therapy Treatment ? ?Patient Details  ?Name: Joel Johnston ?MRN: [id number] ?Date of Birth: January 19, 1961 ?Referring Provider (PT): Dr Benjamin Stain ? ? ?Encounter Date: 12/25/2021 ? ? PT End of Session - 12/25/21 0801   ? ? Visit Number 2   ? Number of Visits 12   ? Date for PT Re-Evaluation 01/30/22   ? PT Start Time 0800   ? PT Stop Time 0848   ? PT Time Calculation (min) 48 min   ? Activity Tolerance Patient tolerated treatment well   ? ?  ?  ? ?  ? ? ?Past Medical History:  ?Diagnosis Date  ? Hyperplastic polyps of stomach   ? Hypertension   ? ? ?History reviewed. No pertinent surgical history. ? ?There were no vitals filed for this visit. ? ? Subjective Assessment - 12/25/21 0802   ? ? Subjective Rt shoulder pain has continued. Good relief last night with Goodie Powder. Still feels better this moring.   ? Currently in Pain? No/denies   ? Pain Score 0-No pain   ? Pain Location Shoulder   ? Pain Orientation Right   ? ?  ?  ? ?  ? ? ? ? ? [facility] PT Assessment - 12/25/21 0001   ? ?  ? Assessment  ? Medical Diagnosis Rt shoulder pain; bilat hip pain   ? Referring Provider (PT) Dr Benjamin Stain   ? Onset Date/Surgical Date 08/27/20   ? Hand Dominance Right   ? Next MD Visit 01/24/22   ? Prior Therapy none   ?  ? Palpation  ? Spinal mobility hypomobile thoracic spine with PA and lateral mobs   ? Palpation comment muscular tightness through the pecs; upper trap; leveator; teres Rt > Lt   ? ?  ?  ? ?  ? ? ? ? ? ? ? ? ? ? ? ? ? ? ? ? [facility] Adult PT Treatment/Exercise - 12/25/21 0001   ? ?  ? Transfers  ? Five time sit to stand comments  \   ?  ? Shoulder Exercises: Standing  ? Row Strengthening;Both;10 reps;Theraband   ? Theraband Level (Shoulder Row) Level 4 (Blue)   ? Retraction Strengthening;Both;10 reps;Theraband   ? Theraband Level (Shoulder Retraction) Level 2 (Red)    ? Other Standing Exercises chin tuck 10 sec x 5 reps; scap squeeze 10 sec x 5; L's x 10 reps; W's x 10 reps with foam roller   ?  ? Shoulder Exercises: Stretch  ? Table Stretch -Flexion Limitations biceps stretch 30 sec x 2 reps   ? Table Stretch - ABduction Limitations supine T x ~ 2 min arms ~ 90 deg   ? Other Shoulder Stretches doorway stretch 3 postions x 2 reps 20-30 sec hold to pt tolerance   ?  ? Moist Heat Therapy  ? Number Minutes Moist Heat 10 Minutes   ? Moist Heat Location Shoulder   ?  ? Manual Therapy  ? Manual therapy comments skilled palpation to assess response to DN and manual work   ? Joint Mobilization PA mobs Rt shoulder   ? Soft tissue mobilization deep tissue work through the Rt shoulder girdle focus on the pecs and biceps, anterior deltoid   ? Passive ROM Rt shoulder ER   ? Manual Traction through long arm 20 sec hold x 3 reps   ? ?  ?  ? ?  ? ? ? ? ? ? ? ? ? ?  PT Education - 12/25/21 0813   ? ? Education Details HEP   ? Person(s) Educated Patient   ? Methods Explanation;Demonstration;Tactile cues;Verbal cues   ? Comprehension Verbalized understanding;Returned demonstration;Verbal cues required;Tactile cues required   ? ?  ?  ? ?  ? ? ? ? ? ? PT Long Term Goals - 12/19/21 1757   ? ?  ? PT LONG TERM GOAL #1  ? Title Decrease Rt shoulder pain by 50-75% allowing patient to continue work and functional activities with minimal pain   ? Time 6   ? Period Weeks   ? Status New   ? Target Date 01/30/22   ?  ? PT LONG TERM GOAL #2  ? Title Increase shoulder AROM by 5-7 degrees in elevation bilat   ? Time 6   ? Period Weeks   ? Status New   ? Target Date 01/30/22   ?  ? PT LONG TERM GOAL #3  ? Title Improve posterior shoulder girdle strength to 5/5 with patient to demonstrate improved upright posture and alignment with posterior shoulder girdle engaged   ? Time 6   ? Period Weeks   ? Status New   ? Target Date 01/30/22   ?  ? PT LONG TERM GOAL #4  ? Title Independent in HEP   ? Time 6   ? Period Weeks    ? Status New   ? Target Date 01/30/22   ?  ? PT LONG TERM GOAL #5  ? Title Improve functional limitation score to 68   ? Time 6   ? Period Weeks   ? Status New   ? Target Date 01/30/22   ? ?  ?  ? ?  ? ? ? ? ? ? ? ? Plan - 12/25/21 0805   ? ? Clinical Impression Statement Continued pain on an intermittent basis. Muscular tightness and poor posture continue. Patient has used TENS unit at home which helps. he also has relief with OTC meds. Reviewed and progressed with exercises today. Trial of manual work/DN tolerated well.   ? Rehab Potential Good   ? PT Frequency 2x / week   ? PT Duration 6 weeks   ? PT Treatment/Interventions ADLs/Self Care Home Management;Aquatic Therapy;Cryotherapy;Electrical Stimulation;Iontophoresis 4mg /ml Dexamethasone;Moist Heat;Ultrasound;Therapeutic activities;Therapeutic exercise;Neuromuscular re-education;Patient/family education;Manual techniques;Passive range of motion;Taping   ? PT Next Visit Plan review and progress HEP; assess response to trial of DN vs manual work to increase tissue extensibility through the anterior shoulder/pecs; posterior shoulder girdle strengthening; modalities as indicated   ? PT Home Exercise Plan [email or web address]   ? Consulted and Agree with Plan of Care Patient   ? ?  ?  ? ?  ? ? ?Patient will benefit from skilled therapeutic intervention in order to improve the following deficits and impairments:    ? ?Visit Diagnosis: ?Chronic right shoulder pain ? ?Other symptoms and signs involving the musculoskeletal system ? ?Abnormal posture ? ?Muscle weakness (generalized) ? ? ? ? ?Problem List ?Patient Active Problem List  ? Diagnosis Date Noted  ? Impingement syndrome, shoulder, right 12/13/2021  ? Chronic pain of right hip 12/13/2021  ? Erectile dysfunction 01/30/2021  ? Prediabetes 01/18/2020  ? Bilateral primary osteoarthritis of knee 01/17/2020  ? Asymptomatic hypertensive urgency 07/14/2016  ? Essential hypertension, benign 02/21/2011  ? ? ?02/23/2011, PT,  MPH ?12/25/2021, 8:41 AM ? ?[place] [facility] ?[address] [place] [address] ?[place], [place], [place] ?Phone: [phone]   Fax:  [phone] ? ?  Name: Izeyah Deike ?MRN: [id number] ?Date of Birth: [birth year] ? ? ? ?

## 2022-01-01 ENCOUNTER — Ambulatory Visit: Payer: BC Managed Care – PPO | Admitting: Physical Therapy

## 2022-01-01 ENCOUNTER — Other Ambulatory Visit: Payer: Self-pay

## 2022-01-01 DIAGNOSIS — M6281 Muscle weakness (generalized): Secondary | ICD-10-CM

## 2022-01-01 DIAGNOSIS — G8929 Other chronic pain: Secondary | ICD-10-CM

## 2022-01-01 DIAGNOSIS — M25511 Pain in right shoulder: Secondary | ICD-10-CM | POA: Diagnosis not present

## 2022-01-01 DIAGNOSIS — R293 Abnormal posture: Secondary | ICD-10-CM | POA: Diagnosis not present

## 2022-01-01 DIAGNOSIS — R29898 Other symptoms and signs involving the musculoskeletal system: Secondary | ICD-10-CM

## 2022-01-01 NOTE — Therapy (Signed)
[facility] [address] [place] [address] [place], [place], [address] Phone: [phone]   Fax:  [phone]  Physical Therapy Treatment  Patient Details  Name: Joel Johnston MRN: [id number] Date of Birth: [birth year] Referring Provider (PT): Dr Benjamin Stain   Encounter Date: 01/01/2022   PT End of Session - 01/01/22 1639     Visit Number 3    Number of Visits 12    Date for PT Re-Evaluation 01/30/22    PT Start Time 1600    PT Stop Time 1643    PT Time Calculation (min) 43 min    Activity Tolerance Patient tolerated treatment well    Behavior During Therapy [facility] for tasks assessed/performed             Past Medical History:  Diagnosis Date   Hyperplastic polyps of stomach    Hypertension     No past surgical history on file.  There were no vitals filed for this visit.   Subjective Assessment - 01/01/22 1557     Subjective Pt states he feels "a lot better". He has been having good relief with exercises at home    Patient Stated Goals get rid of the shoulder pain so he can sleep without waking due to pain    Currently in Pain? No/denies                [facility] PT Assessment - 01/01/22 0001       Assessment   Medical Diagnosis Rt shoulder pain; bilat hip pain    Referring Provider (PT) Dr Benjamin Stain    Onset Date/Surgical Date 08/27/20    Hand Dominance Right    Next MD Visit 01/24/22    Prior Therapy none      AROM   Right Shoulder Flexion 163 Degrees    Right Shoulder ABduction 168 Degrees    Right Shoulder External Rotation 90 Degrees                           [facility] Adult PT Treatment/Exercise - 01/01/22 0001       Exercises   Exercises Shoulder      Shoulder Exercises: Standing   Extension 20 reps    Theraband Level (Shoulder Extension) Level 4 (Blue)    Extension Limitations isometric hold end range    Row Strengthening;20 reps    Theraband Level (Shoulder Row) Level 4 (Blue)     Row Limitations isometric hold end range x 3 sec    Other Standing Exercises chin tuck x 10, scap squeeze x 10    Other Standing Exercises bilat ER red TB 2 x 10      Shoulder Exercises: ROM/Strengthening   UBE (Upper Arm Bike) L2 x 2 min alt fwd/bkwd      Shoulder Exercises: Stretch   Table Stretch -Flexion Limitations biceps stretch 30 sec x 2 reps    Table Stretch - ABduction Limitations supine T x ~ 2 min arms ~ 90 deg    Other Shoulder Stretches doorway stretch 3 postions x 2 reps 20-30 sec hold to pt tolerance      Moist Heat Therapy   Number Minutes Moist Heat 10 Minutes    Moist Heat Location Shoulder      Manual Therapy   Joint Mobilization inferior and A/P mobs Rt shoulder    Soft tissue mobilization STM Rt biceps, anterior deltoid, pecs    Manual Traction through long arm 20 sec hold  x 3 reps                          PT Long Term Goals - 12/19/21 1757       PT LONG TERM GOAL #1   Title Decrease Rt shoulder pain by 50-75% allowing patient to continue work and functional activities with minimal pain    Time 6    Period Weeks    Status New    Target Date 01/30/22      PT LONG TERM GOAL #2   Title Increase shoulder AROM by 5-7 degrees in elevation bilat    Time 6    Period Weeks    Status New    Target Date 01/30/22      PT LONG TERM GOAL #3   Title Improve posterior shoulder girdle strength to 5/5 with patient to demonstrate improved upright posture and alignment with posterior shoulder girdle engaged    Time 6    Period Weeks    Status New    Target Date 01/30/22      PT LONG TERM GOAL #4   Title Independent in HEP    Time 6    Period Weeks    Status New    Target Date 01/30/22      PT LONG TERM GOAL #5   Title Improve functional limitation score to 68    Time 6    Period Weeks    Status New    Target Date 01/30/22                   Plan - 01/01/22 1639     Clinical Impression Statement Pt with much improved Rt  shoulder ROM and decreased pain. Pt requires cues for posture with theraband exercises. He will benefit from continued postural strength and endurance    PT Next Visit Plan progress HEP, progress postural strength and endurance    PT Home Exercise Plan [email or web address]    Consulted and Agree with Plan of Care Patient             Patient will benefit from skilled therapeutic intervention in order to improve the following deficits and impairments:     Visit Diagnosis: Chronic right shoulder pain  Other symptoms and signs involving the musculoskeletal system  Abnormal posture  Muscle weakness (generalized)     Problem List Patient Active Problem List   Diagnosis Date Noted   Impingement syndrome, shoulder, right 12/13/2021   Chronic pain of right hip 12/13/2021   Erectile dysfunction 01/30/2021   Prediabetes 01/18/2020   Bilateral primary osteoarthritis of knee 01/17/2020   Asymptomatic hypertensive urgency 07/14/2016   Essential hypertension, benign 02/21/2011    Virgil Lightner, PT 01/01/2022, 4:40 PM  [facility] [address] [place] [address] [place], [place], [address] Phone: [phone]   Fax:  [phone]  Name: Joel Johnston MRN: [id number] Date of Birth: 1961-10-20

## 2022-01-10 ENCOUNTER — Ambulatory Visit: Payer: BC Managed Care – PPO | Admitting: Physical Therapy

## 2022-01-24 ENCOUNTER — Ambulatory Visit (INDEPENDENT_AMBULATORY_CARE_PROVIDER_SITE_OTHER): Payer: BC Managed Care – PPO

## 2022-01-24 ENCOUNTER — Ambulatory Visit: Payer: BC Managed Care – PPO | Admitting: Sports Medicine

## 2022-01-24 DIAGNOSIS — M25552 Pain in left hip: Secondary | ICD-10-CM

## 2022-01-24 DIAGNOSIS — M25551 Pain in right hip: Secondary | ICD-10-CM | POA: Diagnosis not present

## 2022-01-24 DIAGNOSIS — M170 Bilateral primary osteoarthritis of knee: Secondary | ICD-10-CM

## 2022-01-24 DIAGNOSIS — M7541 Impingement syndrome of right shoulder: Secondary | ICD-10-CM | POA: Diagnosis not present

## 2022-01-24 DIAGNOSIS — M545 Low back pain, unspecified: Secondary | ICD-10-CM | POA: Diagnosis not present

## 2022-01-24 DIAGNOSIS — M47816 Spondylosis without myelopathy or radiculopathy, lumbar region: Secondary | ICD-10-CM | POA: Diagnosis not present

## 2022-01-24 DIAGNOSIS — G8929 Other chronic pain: Secondary | ICD-10-CM

## 2022-01-24 DIAGNOSIS — M2578 Osteophyte, vertebrae: Secondary | ICD-10-CM | POA: Diagnosis not present

## 2022-01-24 MED ORDER — PREDNISONE 50 MG PO TABS
ORAL_TABLET | ORAL | 0 refills | Status: DC
Start: 2022-01-24 — End: 2022-07-09

## 2022-01-24 NOTE — Assessment & Plan Note (Signed)
End-stage bone-on-bone knee osteoarthritis on x-rays, doing a lot better after injections back in January, return as needed for this. ?

## 2022-01-24 NOTE — Progress Notes (Signed)
? ? ?  Procedures performed today:   ? ?None. ? ?Independent interpretation of notes and tests performed by another provider:  ? ?None. ? ?Brief History, Exam, Impression, and Recommendations:   ? ?Bilateral primary osteoarthritis of knee ?End-stage bone-on-bone knee osteoarthritis on x-rays, doing a lot better after injections back in January, return as needed for this. ? ?Chronic pain of right hip ?Osteoarthritis noted on x-rays, doing better with physical therapy and Celebrex. ? ?Impingement syndrome, shoulder, right ?Resolved with Celebrex and physical therapy. ? ?Lumbar spondylosis ?Left-sided posterior hip pain. ?Not reproduced with internal rotation, palpation of the greater trochanteric bursa, or resisted abduction of the hips consistent with a lumbar spine pain generator, adding 5 days of prednisone, hold off on Celebrex until done with prednisone, home conditioning given, lumbar spine x-rays ordered. ?Return to see me in 4 weeks, interventional planning if no better. ? ?Chronic process with exacerbation and pharmacologic intervention ? ?___________________________________________ ?Ihor Austin. Benjamin Stain, M.D., ABFM., CAQSM. ?Primary Care and Sports Medicine ?[facility] ? ?Adjunct Instructor of Family Medicine  ?[employer] ?

## 2022-01-24 NOTE — Assessment & Plan Note (Signed)
Resolved with Celebrex and physical therapy. ?

## 2022-01-24 NOTE — Assessment & Plan Note (Signed)
Osteoarthritis noted on x-rays, doing better with physical therapy and Celebrex. ?

## 2022-01-24 NOTE — Assessment & Plan Note (Signed)
Left-sided posterior hip pain. ?Not reproduced with internal rotation, palpation of the greater trochanteric bursa, or resisted abduction of the hips consistent with a lumbar spine pain generator, adding 5 days of prednisone, hold off on Celebrex until done with prednisone, home conditioning given, lumbar spine x-rays ordered. ?Return to see me in 4 weeks, interventional planning if no better. ?

## 2022-01-31 ENCOUNTER — Other Ambulatory Visit: Payer: Self-pay | Admitting: Physician Assistant

## 2022-01-31 DIAGNOSIS — N529 Male erectile dysfunction, unspecified: Secondary | ICD-10-CM

## 2022-02-03 NOTE — Telephone Encounter (Signed)
Last written 01/30/2021 #5 with 11 refills ?Last appt 10/25/2021 ?

## 2022-03-07 ENCOUNTER — Ambulatory Visit: Payer: BC Managed Care – PPO | Admitting: Sports Medicine

## 2022-03-07 DIAGNOSIS — M47816 Spondylosis without myelopathy or radiculopathy, lumbar region: Secondary | ICD-10-CM

## 2022-03-07 NOTE — Assessment & Plan Note (Signed)
Exam he had some left-sided posterior hip pain, internal rotation did not reproduce pain suggesting it was not coming from his hip joint, he had no tenderness over the greater trochanter, we suspected a lumbar spine pain generator, we added some prednisone, did home conditioning, and he returns today pain-free with regards to his back. ?Return as needed for this. ?

## 2022-03-07 NOTE — Progress Notes (Signed)
? ? ?  Procedures performed today:   ? ?None. ? ?Independent interpretation of notes and tests performed by another provider:  ? ?None. ? ?Brief History, Exam, Impression, and Recommendations:   ? ?Lumbar spondylosis ?Exam he had some left-sided posterior hip pain, internal rotation did not reproduce pain suggesting it was not coming from his hip joint, he had no tenderness over the greater trochanter, we suspected a lumbar spine pain generator, we added some prednisone, did home conditioning, and he returns today pain-free with regards to his back. ?Return as needed for this. ? ? ? ?___________________________________________ ?Ihor Austin. Benjamin Stain, M.D., ABFM., CAQSM. ?Primary Care and Sports Medicine ?[facility] ? ?Adjunct Instructor of Family Medicine  ?[employer] ?

## 2022-04-10 DIAGNOSIS — H25813 Combined forms of age-related cataract, bilateral: Secondary | ICD-10-CM | POA: Diagnosis not present

## 2022-04-10 DIAGNOSIS — H5203 Hypermetropia, bilateral: Secondary | ICD-10-CM | POA: Diagnosis not present

## 2022-04-10 DIAGNOSIS — H524 Presbyopia: Secondary | ICD-10-CM | POA: Diagnosis not present

## 2022-04-18 ENCOUNTER — Other Ambulatory Visit: Payer: Self-pay | Admitting: Physician Assistant

## 2022-04-18 DIAGNOSIS — M170 Bilateral primary osteoarthritis of knee: Secondary | ICD-10-CM

## 2022-05-08 ENCOUNTER — Other Ambulatory Visit: Payer: Self-pay | Admitting: Physician Assistant

## 2022-05-08 DIAGNOSIS — I10 Essential (primary) hypertension: Secondary | ICD-10-CM

## 2022-05-14 ENCOUNTER — Ambulatory Visit (INDEPENDENT_AMBULATORY_CARE_PROVIDER_SITE_OTHER): Payer: BC Managed Care – PPO

## 2022-05-14 ENCOUNTER — Ambulatory Visit: Payer: BC Managed Care – PPO | Admitting: Sports Medicine

## 2022-05-14 DIAGNOSIS — M25511 Pain in right shoulder: Secondary | ICD-10-CM

## 2022-05-14 DIAGNOSIS — M7541 Impingement syndrome of right shoulder: Secondary | ICD-10-CM

## 2022-05-14 DIAGNOSIS — G8929 Other chronic pain: Secondary | ICD-10-CM

## 2022-05-14 NOTE — Progress Notes (Signed)
    Procedures performed today:    Procedure: Real-time Ultrasound Guided injection of the right bicep sheath  device: Samsung HS60  Verbal informed consent obtained.  Time-out conducted.  Noted no overlying erythema, induration, or other signs of local infection.  Skin prepped in a sterile fashion.  Local anesthesia: Topical Ethyl chloride.  With sterile technique and under real time ultrasound guidance: Noted bicep sheath effusion, 1 cc Kenalog 40, 2 cc lidocaine, 2 cc bupivacaine injected easily Completed without difficulty  Advised to call if fevers/chills, erythema, induration, drainage, or persistent bleeding.  Images permanently stored and available for review in PACS.  Impression: Technically successful ultrasound guided injection.  Independent interpretation of notes and tests performed by another provider:   None.  Brief History, Exam, Impression, and Recommendations:    Chronic right shoulder pain Bernie returns, he is a very pleasant 61 year old male with chronic multifactorial right shoulder pain, historically treated about 4 months ago successfully with Celebrex and physical therapy, today he was at work, turned an object and felt severe pain, pain is anterior shoulder, he has multiple positive signs on exam but the strongest positive signs are positive Yergason and speeds tests suspicious for biceps tendinitis versus tearing. Due to severity of pain we will inject his bicep sheath today with ultrasound guidance, return to see me in 6 weeks.    ____________________________________________ Joel Johnston. Benjamin Stain, M.D., ABFM., CAQSM., AME. Primary Care and Sports Medicine [facility]  Adjunct Professor of Family Medicine  [employer]  Restaurant manager, fast food

## 2022-05-14 NOTE — Assessment & Plan Note (Addendum)
Joel Johnston returns, he is a very pleasant 61 year old male with chronic multifactorial right shoulder pain, historically treated about 4 months ago successfully with Celebrex and physical therapy, today he was at work, turned an object and felt severe pain, pain is anterior shoulder, he has multiple positive signs on exam but the strongest positive signs are positive Yergason and speeds tests suspicious for biceps tendinitis versus tearing. Due to severity of pain we will inject his bicep sheath today with ultrasound guidance, return to see me in 6 weeks.

## 2022-06-26 ENCOUNTER — Ambulatory Visit: Payer: BC Managed Care – PPO | Admitting: Sports Medicine

## 2022-07-09 ENCOUNTER — Ambulatory Visit: Payer: BC Managed Care – PPO | Admitting: Sports Medicine

## 2022-07-09 DIAGNOSIS — G8929 Other chronic pain: Secondary | ICD-10-CM

## 2022-07-09 DIAGNOSIS — M25511 Pain in right shoulder: Secondary | ICD-10-CM | POA: Diagnosis not present

## 2022-07-09 MED ORDER — MELOXICAM 15 MG PO TABS: ORAL_TABLET | ORAL | 3 refills | Status: DC

## 2022-07-09 MED ORDER — ACETAMINOPHEN ER 650 MG PO TBCR: 650.0000 mg | EXTENDED_RELEASE_TABLET | Freq: Two times a day (BID) | ORAL | 3 refills | Status: AC

## 2022-07-09 NOTE — Progress Notes (Signed)
    Procedures performed today:    None.  Independent interpretation of notes and tests performed by another provider:   None.  Brief History, Exam, Impression, and Recommendations:    Chronic right shoulder pain Joel Johnston returns, he is a very pleasant 61 year old male, he did have chronic multifactorial right shoulder pain, historically successfully treated with Celebrex and physical therapy. At the last visit he had moved awkwardly and felt pain anterior shoulder, he had multiple positive signs but the strongest positive signs were speeds and Yergason's, we injected his bicep sheath with ultrasound guidance and he returns today with complete resolution of his shoulder pain. Continue home conditioning. He is having other joint aches and pains and feels like Celebrex is not doing the trick so we will switch to meloxicam with arthritis strength Tylenol. Return to see [place] as needed.  Chronic process not at goal with pharmacologic intervention  ____________________________________________ Ihor Austin. Benjamin Stain, M.D., ABFM., CAQSM., AME. Primary Care and Sports Medicine [facility]  Adjunct Professor of Family Medicine  [place] [employer] [facility]  Restaurant manager, fast food

## 2022-07-09 NOTE — Assessment & Plan Note (Signed)
Joel Johnston returns, he is a very pleasant 61 year old male, he did have chronic multifactorial right shoulder pain, historically successfully treated with Celebrex and physical therapy. At the last visit he had moved awkwardly and felt pain anterior shoulder, he had multiple positive signs but the strongest positive signs were speeds and Yergason's, we injected his bicep sheath with ultrasound guidance and he returns today with complete resolution of his shoulder pain. Continue home conditioning. He is having other joint aches and pains and feels like Celebrex is not doing the trick so we will switch to meloxicam with arthritis strength Tylenol. Return to see [place] as needed.

## 2022-07-24 ENCOUNTER — Other Ambulatory Visit: Payer: Self-pay | Admitting: Physician Assistant

## 2022-07-24 DIAGNOSIS — M170 Bilateral primary osteoarthritis of knee: Secondary | ICD-10-CM

## 2022-08-02 ENCOUNTER — Other Ambulatory Visit: Payer: Self-pay | Admitting: Physician Assistant

## 2022-08-02 DIAGNOSIS — I10 Essential (primary) hypertension: Secondary | ICD-10-CM

## 2022-09-06 ENCOUNTER — Other Ambulatory Visit: Payer: Self-pay | Admitting: Physician Assistant

## 2022-09-06 DIAGNOSIS — I10 Essential (primary) hypertension: Secondary | ICD-10-CM

## 2022-09-08 ENCOUNTER — Telehealth: Payer: Self-pay | Admitting: Neurology

## 2022-09-08 NOTE — Telephone Encounter (Signed)
Patient left a vm on my line wanting to make a follow up appt for his blood pressure and to get a flu shot. Please call patient to schedule a follow up with Kathyleen Radice. [phone]. Thanks.

## 2022-09-09 ENCOUNTER — Other Ambulatory Visit: Payer: Self-pay | Admitting: Family Medicine

## 2022-09-09 DIAGNOSIS — I10 Essential (primary) hypertension: Secondary | ICD-10-CM

## 2022-09-09 NOTE — Telephone Encounter (Signed)
Called patient and scheduled for 11/22. Joel Johnston

## 2022-09-10 NOTE — Telephone Encounter (Signed)
Scheduled 09/17/2022 @9 :30. tvt

## 2022-09-10 NOTE — Telephone Encounter (Signed)
Please call pt and have pt schedule an appt with pcp for bp medication and labs

## 2022-09-17 ENCOUNTER — Encounter: Payer: Self-pay | Admitting: Physician Assistant

## 2022-09-17 ENCOUNTER — Ambulatory Visit: Payer: BC Managed Care – PPO | Admitting: Physician Assistant

## 2022-09-17 VITALS — BP 146/90 | HR 83 | Ht 67.0 in | Wt 185.0 lb

## 2022-09-17 DIAGNOSIS — Z13220 Encounter for screening for lipoid disorders: Secondary | ICD-10-CM | POA: Diagnosis not present

## 2022-09-17 DIAGNOSIS — I10 Essential (primary) hypertension: Secondary | ICD-10-CM | POA: Diagnosis not present

## 2022-09-17 DIAGNOSIS — Z125 Encounter for screening for malignant neoplasm of prostate: Secondary | ICD-10-CM | POA: Diagnosis not present

## 2022-09-17 DIAGNOSIS — Z23 Encounter for immunization: Secondary | ICD-10-CM | POA: Diagnosis not present

## 2022-09-17 DIAGNOSIS — M170 Bilateral primary osteoarthritis of knee: Secondary | ICD-10-CM

## 2022-09-17 DIAGNOSIS — Z0000 Encounter for general adult medical examination without abnormal findings: Secondary | ICD-10-CM

## 2022-09-17 DIAGNOSIS — E785 Hyperlipidemia, unspecified: Secondary | ICD-10-CM

## 2022-09-17 DIAGNOSIS — Z1329 Encounter for screening for other suspected endocrine disorder: Secondary | ICD-10-CM | POA: Diagnosis not present

## 2022-09-17 MED ORDER — LOSARTAN POTASSIUM-HCTZ 100-25 MG PO TABS: ORAL_TABLET | ORAL | 1 refills | Status: DC

## 2022-09-17 MED ORDER — AMLODIPINE BESYLATE 10 MG PO TABS: 10.0000 mg | ORAL_TABLET | Freq: Every day | ORAL | 1 refills | Status: DC

## 2022-09-17 MED ORDER — DICLOFENAC SODIUM 75 MG PO TBEC: 75.0000 mg | DELAYED_RELEASE_TABLET | Freq: Two times a day (BID) | ORAL | 5 refills | Status: DC

## 2022-09-17 MED ORDER — ATORVASTATIN CALCIUM 40 MG PO TABS: 40.0000 mg | ORAL_TABLET | Freq: Every day | ORAL | 3 refills | Status: DC

## 2022-09-17 NOTE — Progress Notes (Signed)
Established Patient Office Visit  Subjective   Patient ID: Joel Johnston, male    DOB: [birth year]  Age: 61 y.o. MRN: [id number]  Chief Complaint  Patient presents with   Follow-up   Hypertension    HPI Pt is a 61 yo male with HTN, Pre-diabetes, chronic OA pain who presents to the clinic for medication refills.   He is compliant with his medications. He denies any SOB, CP, palpitations, headaches or vision changes. He is not checking BP at home.   He remains active but having chronic pain in right shoulder and bilateral knees. He has not seen Dr. Darene Lamer in a while. Failed celebrex and mobic. Tumeric is helping some.  .. Active Ambulatory Problems    Diagnosis Date Noted   Essential hypertension, benign 02/21/2011   Asymptomatic hypertensive urgency 07/14/2016   Bilateral primary osteoarthritis of knee 01/17/2020   Prediabetes 01/18/2020   Erectile dysfunction 01/30/2021   Chronic right shoulder pain 12/13/2021   Chronic pain of right hip 12/13/2021   Lumbar spondylosis 01/24/2022   Resolved Ambulatory Problems    Diagnosis Date Noted   Chronic pain of left knee 12/01/2018   Past Medical History:  Diagnosis Date   Hyperplastic polyps of stomach    Hypertension     ROS See HPI.    Objective:     BP (!) 146/90   Pulse 83   Ht 5\' 7"  (1.702 m)   Wt 185 lb (83.9 kg)   SpO2 97%   BMI 28.98 kg/m  BP Readings from Last 3 Encounters:  09/17/22 (!) 146/90  10/25/21 (!) 144/97  08/21/21 (!) 145/80   Wt Readings from Last 3 Encounters:  09/17/22 185 lb (83.9 kg)  10/25/21 181 lb (82.1 kg)  08/21/21 176 lb 1.9 oz (79.9 kg)    ..    09/17/2022    9:45 AM 01/30/2021    8:58 AM 01/16/2020    3:02 PM 12/01/2018    7:55 AM 08/17/2017    4:10 PM  Depression screen PHQ 2/9  Decreased Interest 0 0 0 0 0  Down, Depressed, Hopeless 0 0 0 0 0  PHQ - 2 Score 0 0 0 0 0  Altered sleeping   0 0   Tired, decreased energy   0 0   Change in appetite   0 0   Feeling bad or failure  about yourself    0    Trouble concentrating   0 0   Moving slowly or fidgety/restless   0 0   Suicidal thoughts   0 0   PHQ-9 Score   0 0   Difficult doing work/chores   Not difficult at all Not difficult at all       Physical Exam Constitutional:      Appearance: Normal appearance.  HENT:     Head: Normocephalic.  Cardiovascular:     Rate and Rhythm: Normal rate and regular rhythm.     Pulses: Normal pulses.  Pulmonary:     Effort: Pulmonary effort is normal.     Breath sounds: Normal breath sounds.  Musculoskeletal:     Cervical back: Normal range of motion and neck supple. No tenderness.     Right lower leg: No edema.     Left lower leg: No edema.  Lymphadenopathy:     Cervical: No cervical adenopathy.  Neurological:     General: No focal deficit present.     Mental Status: He is alert and oriented to person,  place, and time.  Psychiatric:        Mood and Affect: Mood normal.        The 10-year ASCVD risk score (Arnett DK, et al., 2019) is: 16.9%    Assessment & Plan:  Marland KitchenMarland KitchenSammy was seen today for follow-up and hypertension.  Diagnoses and all orders for this visit:  Routine physical examination -     PSA -     TSH -     Lipid Panel w/reflex Direct LDL -     COMPLETE METABOLIC PANEL WITH GFR -     CBC with Differential/Platelet  Essential hypertension, benign -     COMPLETE METABOLIC PANEL WITH GFR -     amLODipine (NORVASC) 10 MG tablet; Take 1 tablet (10 mg total) by mouth daily. -     losartan-hydrochlorothiazide (HYZAAR) 100-25 MG tablet; Take one tablet daily.  Screening PSA (prostate specific antigen) -     PSA  Thyroid disorder screen -     TSH  Screening for lipid disorders -     Lipid Panel w/reflex Direct LDL  Flu vaccine need -     Flu Vaccine QUAD 63mo+IM (Fluarix, Fluzone & Alfiuria Quad PF)  Bilateral primary osteoarthritis of knee -     diclofenac (VOLTAREN) 75 MG EC tablet; Take 1 tablet (75 mg total) by mouth 2 (two) times  daily.  Dyslipidemia (high LDL; low HDL) -     atorvastatin (LIPITOR) 40 MG tablet; Take 1 tablet (40 mg total) by mouth daily.   .. Discussed 150 minutes of exercise a week.  Encouraged vitamin D 1000 units and Calcium 1300mg  or 4 servings of dairy a day.  BP not to goal Increase norvasc 10mg   Continue hyzaar  Fasting labs ordered today No concern with PHQ/mood Discussed OA pain-failed mobic and celebrex Trial of diclofenac Follow up with Dr. CV risk is elevated Pt agreed to start lipitor Recheck BP in 1 month Follow up in 6 months    , PA-C

## 2022-09-18 LAB — CBC WITH DIFFERENTIAL/PLATELET
Absolute Monocytes: 464 cells/uL (ref 200–950)
Basophils Absolute: 31 cells/uL (ref 0–200)
Basophils Relative: 0.6 %
Eosinophils Absolute: 194 cells/uL (ref 15–500)
Eosinophils Relative: 3.8 %
HCT: 51.8 % — ABNORMAL HIGH (ref 38.5–50.0)
Hemoglobin: 17.7 g/dL — ABNORMAL HIGH (ref 13.2–17.1)
Lymphs Abs: 1856 cells/uL (ref 850–3900)
MCH: 30.3 pg (ref 27.0–33.0)
MCHC: 34.2 g/dL (ref 32.0–36.0)
MCV: 88.7 fL (ref 80.0–100.0)
MPV: 9.6 fL (ref 7.5–12.5)
Monocytes Relative: 9.1 %
Neutro Abs: 2555 cells/uL (ref 1500–7800)
Neutrophils Relative %: 50.1 %
Platelets: 237 10*3/uL (ref 140–400)
RBC: 5.84 10*6/uL — ABNORMAL HIGH (ref 4.20–5.80)
RDW: 13.5 % (ref 11.0–15.0)
Total Lymphocyte: 36.4 %
WBC: 5.1 10*3/uL (ref 3.8–10.8)

## 2022-09-18 LAB — COMPLETE METABOLIC PANEL WITH GFR
AG Ratio: 1.6 (calc) (ref 1.0–2.5)
ALT: 25 U/L (ref 9–46)
AST: 16 U/L (ref 10–35)
Albumin: 4.4 g/dL (ref 3.6–5.1)
Alkaline phosphatase (APISO): 117 U/L (ref 35–144)
BUN: 20 mg/dL (ref 7–25)
CO2: 27 mmol/L (ref 20–32)
Calcium: 9.3 mg/dL (ref 8.6–10.3)
Chloride: 106 mmol/L (ref 98–110)
Creat: 0.87 mg/dL (ref 0.70–1.35)
Globulin: 2.7 g/dL (calc) (ref 1.9–3.7)
Glucose, Bld: 102 mg/dL — ABNORMAL HIGH (ref 65–99)
Potassium: 4.2 mmol/L (ref 3.5–5.3)
Sodium: 141 mmol/L (ref 135–146)
Total Bilirubin: 0.4 mg/dL (ref 0.2–1.2)
Total Protein: 7.1 g/dL (ref 6.1–8.1)
eGFR: 98 mL/min/{1.73_m2} (ref >=60)

## 2022-09-18 LAB — LIPID PANEL W/REFLEX DIRECT LDL
Cholesterol: 151 mg/dL (ref <200)
HDL: 50 mg/dL (ref >=40)
LDL Cholesterol (Calc): 85 mg/dL (calc)
Non-HDL Cholesterol (Calc): 101 mg/dL (calc) (ref <130)
Total CHOL/HDL Ratio: 3 (calc) (ref <5.0)
Triglycerides: 73 mg/dL (ref <150)

## 2022-09-18 LAB — PSA: PSA: 1.9 ng/mL (ref <=4.00)

## 2022-09-18 LAB — TSH: TSH: 1.78 mIU/L (ref 0.40–4.50)

## 2022-09-22 ENCOUNTER — Ambulatory Visit: Payer: BC Managed Care – PPO | Admitting: Sports Medicine

## 2022-09-22 NOTE — Progress Notes (Signed)
Joel Johnston,   Cholesterol looks good.  Thyroid looks great.  PsA stable and normal range.  Kidney and liver look good.   Your hemoglobin is elevated. We call this polycythemia. It can be caused by shortness or breath from COPD, sleep apnea are you having any trouble breathing. Can you confirm ou are not smoking? Are you waking up feeling tired and like you have not rested well?

## 2022-09-24 ENCOUNTER — Other Ambulatory Visit: Payer: Self-pay | Admitting: Family Medicine

## 2022-09-24 DIAGNOSIS — D751 Secondary polycythemia: Secondary | ICD-10-CM

## 2022-09-24 NOTE — Progress Notes (Signed)
Additional labs ordered. See if he can get those done next week.

## 2022-10-01 DIAGNOSIS — D751 Secondary polycythemia: Secondary | ICD-10-CM | POA: Diagnosis not present

## 2022-10-03 NOTE — Progress Notes (Signed)
Call patient: B12 looks good, folate is normal.  Hemoglobin looks a little better it is not as elevated as it was 2 weeks ago.  Iron stores are normal.  2 labs still pending.

## 2022-10-05 LAB — ERYTHROPOIETIN: Erythropoietin: 9.5 m[IU]/mL (ref 2.6–18.5)

## 2022-10-05 LAB — HEMOGLOBIN AND HEMATOCRIT, BLOOD
HCT: 48.9 % (ref 38.5–50.0)
Hemoglobin: 17.1 g/dL (ref 13.2–17.1)

## 2022-10-05 LAB — FERRITIN: Ferritin: 167 ng/mL (ref 24–380)

## 2022-10-05 LAB — FOLATE: Folate: 11.7 ng/mL

## 2022-10-05 LAB — VITAMIN B12: Vitamin B-12: 459 pg/mL (ref 200–1100)

## 2022-10-05 LAB — CARBOXYHEMOGLOBIN: Carboxyhemoglobin: 3 %TOTAL HGB

## 2022-10-06 NOTE — Progress Notes (Signed)
Call patient: The erythropoietin and carboxyhemoglobin are also normal which is great.  Since repeat hemoglobin was back into the normal range is like for [place] to keep an eye on it and maybe recheck your CBC in 3 to 6 months.

## 2022-11-04 ENCOUNTER — Other Ambulatory Visit: Payer: Self-pay | Admitting: Sports Medicine

## 2022-11-04 DIAGNOSIS — G8929 Other chronic pain: Secondary | ICD-10-CM

## 2022-11-15 IMAGING — DX DG LUMBAR SPINE COMPLETE 4+V
5 series · 5 of 5 positions shown · non-contrast
Comparison: None.

CLINICAL DATA: Left lower back/hip pain.

EXAM:
LUMBAR SPINE - COMPLETE 4+ VIEW

[l-spine ap]
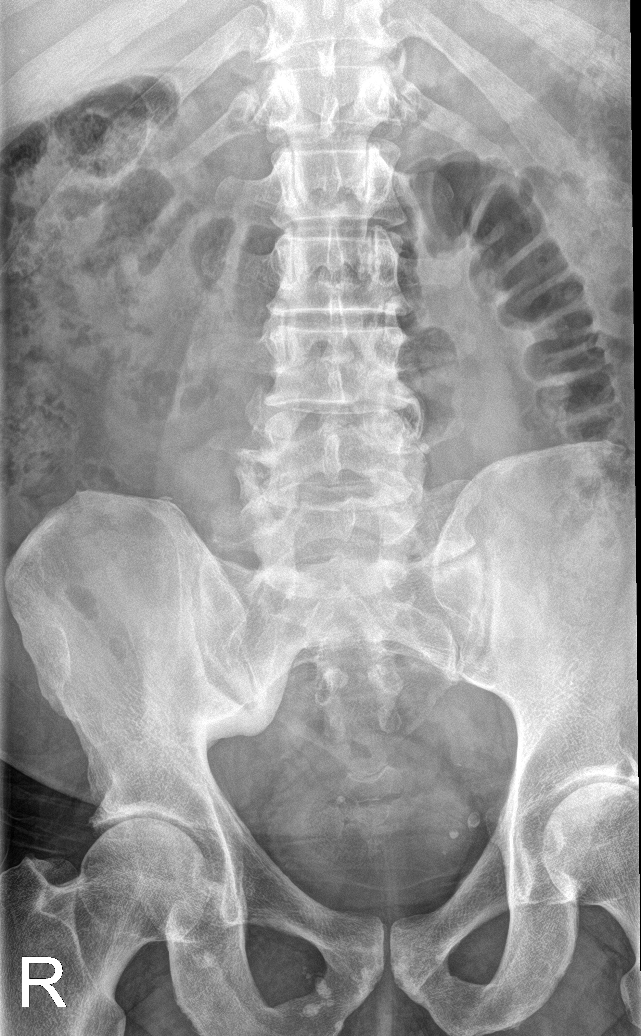

[l-spine obl (1 of 2)]
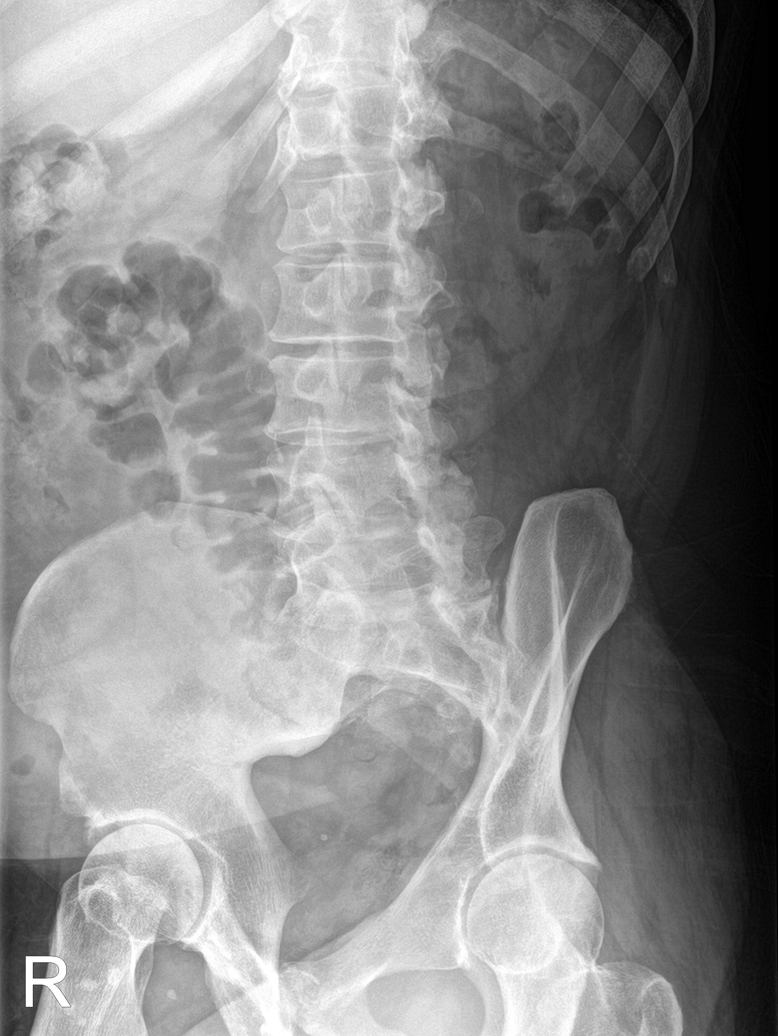

[l-spine obl (2 of 2)]
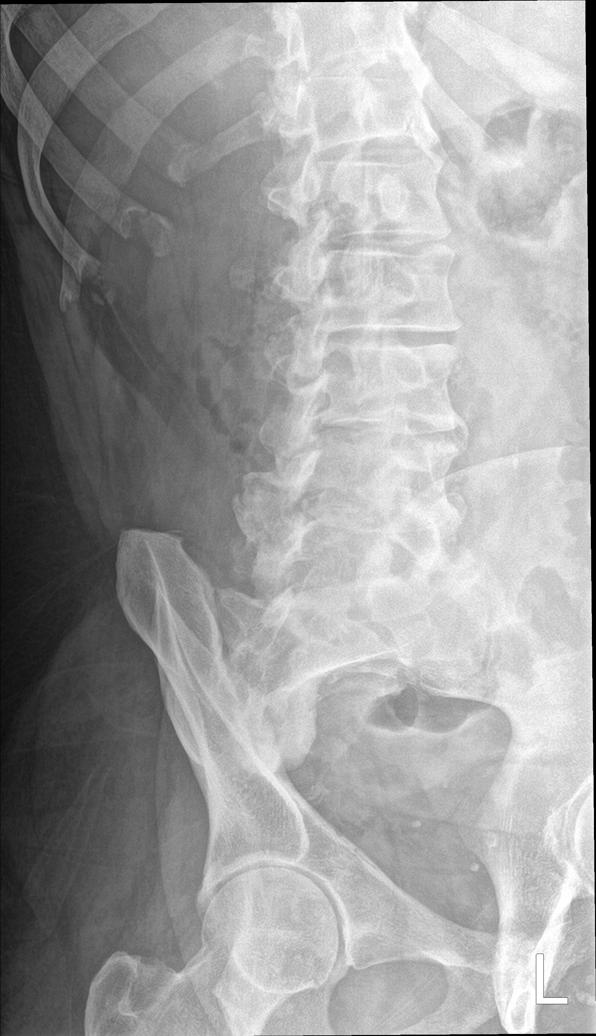

[l-spine lat]
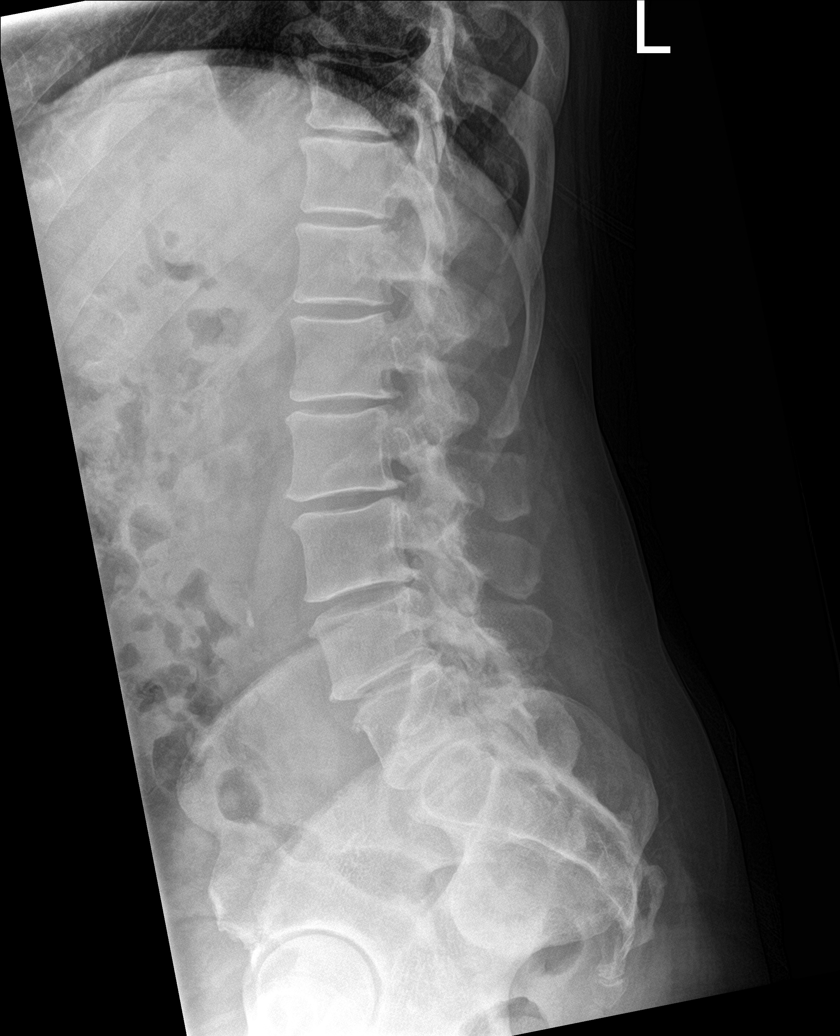

[l-spine spot]
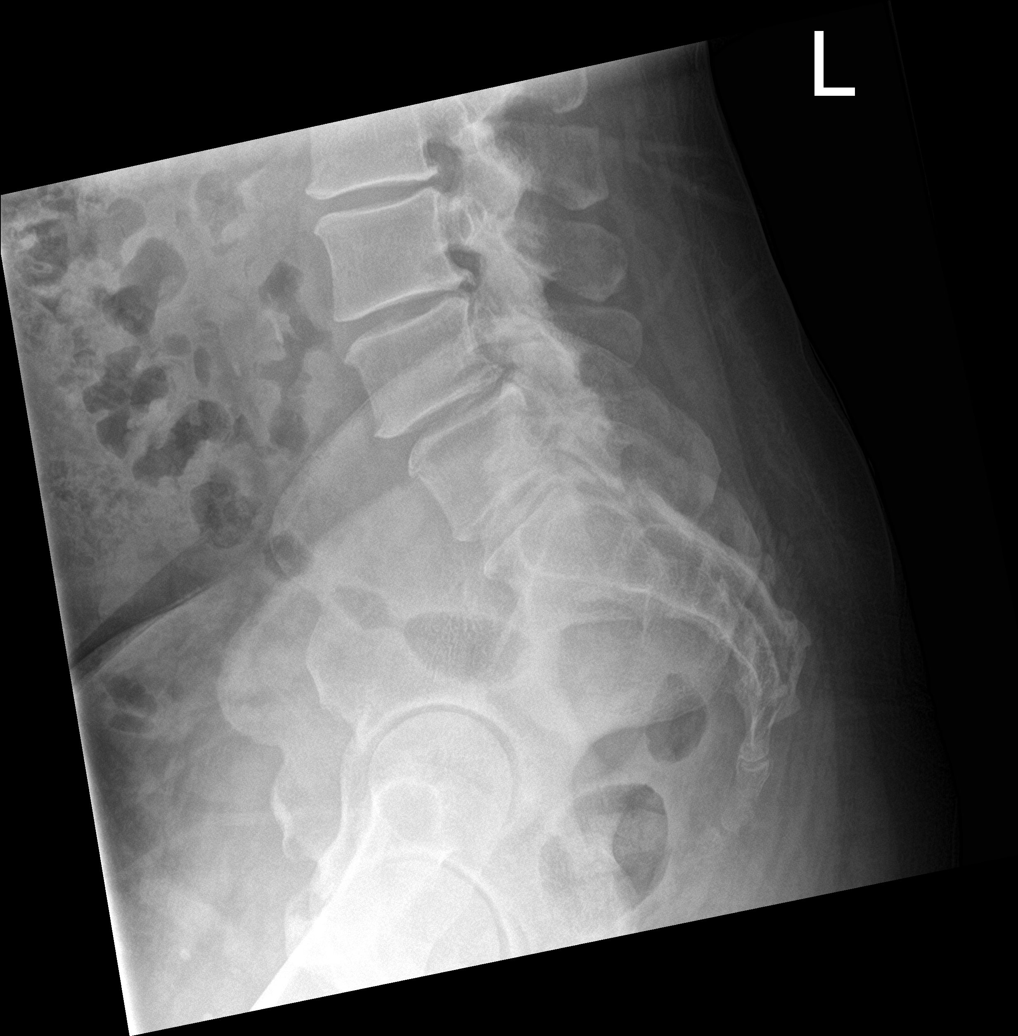

[5 of 5 positions shown; findings below may reference images not displayed]

FINDINGS: There is no evidence of lumbar spine fracture. Alignment is normal.
There is mild intervertebral disc space narrowing at all levels of
the lumbar spine. Endplate osteophyte formation is seen at L4-L5 and
L5-S1. There are degenerative changes of facet joints at L4-L5 and
L5-S1. No focal osseous lesion identified. Soft tissues are within
normal limits.
IMPRESSION: 1. No acute bony abnormality.  No malalignment.
2. Mild-to-moderate degenerative changes.

## 2022-12-02 ENCOUNTER — Other Ambulatory Visit: Payer: Self-pay | Admitting: Physician Assistant

## 2022-12-02 DIAGNOSIS — I10 Essential (primary) hypertension: Secondary | ICD-10-CM

## 2022-12-11 ENCOUNTER — Ambulatory Visit (INDEPENDENT_AMBULATORY_CARE_PROVIDER_SITE_OTHER): Payer: BC Managed Care – PPO

## 2022-12-11 ENCOUNTER — Ambulatory Visit: Payer: BC Managed Care – PPO | Admitting: Sports Medicine

## 2022-12-11 DIAGNOSIS — M170 Bilateral primary osteoarthritis of knee: Secondary | ICD-10-CM

## 2022-12-11 MED ORDER — TRIAMCINOLONE ACETONIDE 40 MG/ML IJ SUSP
80.0000 mg | Freq: Once | INTRAMUSCULAR | Status: AC
  Administered 2022-12-11: 80 mg via INTRAMUSCULAR

## 2022-12-11 NOTE — Progress Notes (Signed)
    Procedures performed today:    Procedure: Real-time Ultrasound Guided injection of the left knee Device: Samsung HS60  Verbal informed consent obtained.  Time-out conducted.  Noted no overlying erythema, induration, or other signs of local infection.  Skin prepped in a sterile fashion.  Local anesthesia: Topical Ethyl chloride.  With sterile technique and under real time ultrasound guidance: Trace effusion noted, 1 cc Kenalog 40, 2 cc lidocaine, 2 cc bupivacaine injected easily Completed without difficulty  Advised to call if fevers/chills, erythema, induration, drainage, or persistent bleeding.  Images permanently stored and available for review in PACS.  Impression: Technically successful ultrasound guided injection.   Procedure: Real-time Ultrasound Guided injection of the right knee Device: Samsung HS60  Verbal informed consent obtained.  Time-out conducted.  Noted no overlying erythema, induration, or other signs of local infection.  Skin prepped in a sterile fashion.  Local anesthesia: Topical Ethyl chloride.  With sterile technique and under real time ultrasound guidance: Trace effusion noted, 1 cc Kenalog 40, 2 cc lidocaine, 2 cc bupivacaine injected easily Completed without difficulty  Advised to call if fevers/chills, erythema, induration, drainage, or persistent bleeding.  Images permanently stored and available for review in PACS.  Impression: Technically successful ultrasound guided injection.  Independent interpretation of notes and tests performed by another provider:   None.  Brief History, Exam, Impression, and Recommendations:    Bilateral primary osteoarthritis of knee End-stage bone-on-bone knee osteoarthritis on x-rays, last injection was back in January 2023, repeat bilateral injection today, return as needed.    ____________________________________________ Gwen Her. Dianah Field, M.D., ABFM., CAQSM., AME. Primary Care and Sports Medicine [facility]  Adjunct Professor of [employer]  Risk manager

## 2022-12-11 NOTE — Assessment & Plan Note (Signed)
End-stage bone-on-bone knee osteoarthritis on x-rays, last injection was back in January 2023, repeat bilateral injection today, return as needed.

## 2023-01-02 ENCOUNTER — Other Ambulatory Visit: Payer: Self-pay | Admitting: Pharmacist

## 2023-01-02 NOTE — Progress Notes (Signed)
Patient appearing on report for [employer] - Hypertension Control report due to last documented ambulatory blood pressure of 146/90 on 09/17/22. Next appointment with PCP is not yet scheduled, due ~02/2023 per last PCP note.   Outreached patient to discuss hypertension control and medication management.   Current antihypertensives: losartan-hctz 100-'25mg'$  daily, amlodipine '10mg'$  daily  Patient does not have an automated upper arm home BP machine.  Current blood pressure readings: not currently checking  Patient denies hypotensive signs and symptoms including dizziness, lightheadedness.  Patient denies hypertensive symptoms including headache, chest pain, shortness of breath.    Assessment/Plan: - Currently unknown control - - Reviewed goal blood pressure <140/90 - Counseled on long term microvascular and macrovascular complications of uncontrolled hypertension - Reviewed appropriate home BP monitoring technique (avoid caffeine, smoking, and exercise for 30 minutes before checking, rest for at least 5 minutes before taking BP, sit with feet flat on the floor and back against a hard surface, uncross legs, and rest arm on flat surface) - Discussed dietary modifications, such as reduced salt intake, focus on whole grains, vegetables, lean proteins - Discussed goal of 150 minutes of moderate intensity physical activity weekly - Recommend continue current regimen, encouraged patient to purchase "omron" brand upper arm BP cuff, check readings at home, and give office a call when ready to get scheduled back with PCP follow up around May. Patient verbalized understanding.   Marland Kitchen Larinda Buttery, PharmD Clinical Pharmacist [facility] [phone]

## 2023-03-11 ENCOUNTER — Other Ambulatory Visit: Payer: Self-pay | Admitting: Physician Assistant

## 2023-03-11 DIAGNOSIS — M170 Bilateral primary osteoarthritis of knee: Secondary | ICD-10-CM

## 2023-03-12 ENCOUNTER — Other Ambulatory Visit: Payer: Self-pay | Admitting: Physician Assistant

## 2023-03-12 DIAGNOSIS — I10 Essential (primary) hypertension: Secondary | ICD-10-CM

## 2023-03-13 ENCOUNTER — Other Ambulatory Visit: Payer: Self-pay | Admitting: Physician Assistant

## 2023-03-13 DIAGNOSIS — I10 Essential (primary) hypertension: Secondary | ICD-10-CM

## 2023-03-29 ENCOUNTER — Other Ambulatory Visit: Payer: Self-pay | Admitting: Physician Assistant

## 2023-03-29 DIAGNOSIS — N529 Male erectile dysfunction, unspecified: Secondary | ICD-10-CM

## 2023-04-10 ENCOUNTER — Encounter: Payer: BC Managed Care – PPO | Admitting: Physician Assistant

## 2023-04-14 ENCOUNTER — Encounter: Payer: Self-pay | Admitting: Physician Assistant

## 2023-04-14 ENCOUNTER — Ambulatory Visit (INDEPENDENT_AMBULATORY_CARE_PROVIDER_SITE_OTHER): Payer: BC Managed Care – PPO | Admitting: Physician Assistant

## 2023-04-14 VITALS — BP 140/100 | HR 83 | Ht 67.0 in | Wt 185.0 lb

## 2023-04-14 DIAGNOSIS — E785 Hyperlipidemia, unspecified: Secondary | ICD-10-CM | POA: Diagnosis not present

## 2023-04-14 DIAGNOSIS — Z125 Encounter for screening for malignant neoplasm of prostate: Secondary | ICD-10-CM

## 2023-04-14 DIAGNOSIS — Z131 Encounter for screening for diabetes mellitus: Secondary | ICD-10-CM | POA: Diagnosis not present

## 2023-04-14 DIAGNOSIS — D751 Secondary polycythemia: Secondary | ICD-10-CM

## 2023-04-14 DIAGNOSIS — R7309 Other abnormal glucose: Secondary | ICD-10-CM | POA: Diagnosis not present

## 2023-04-14 DIAGNOSIS — Z1329 Encounter for screening for other suspected endocrine disorder: Secondary | ICD-10-CM

## 2023-04-14 DIAGNOSIS — F5101 Primary insomnia: Secondary | ICD-10-CM

## 2023-04-14 DIAGNOSIS — Z0000 Encounter for general adult medical examination without abnormal findings: Secondary | ICD-10-CM | POA: Diagnosis not present

## 2023-04-14 DIAGNOSIS — I10 Essential (primary) hypertension: Secondary | ICD-10-CM

## 2023-04-14 LAB — CBC WITH DIFFERENTIAL/PLATELET
Basophils Relative: 0.8 %
Eosinophils Absolute: 391 cells/uL (ref 15–500)
MPV: 9.8 fL (ref 7.5–12.5)
Monocytes Relative: 11.2 %
WBC: 6.2 10*3/uL (ref 3.8–10.8)

## 2023-04-14 MED ORDER — TRAZODONE HCL 50 MG PO TABS
25.0000 mg | ORAL_TABLET | Freq: Every evening | ORAL | 3 refills | Status: DC | PRN
Start: 2023-04-14 — End: 2023-05-07

## 2023-04-14 NOTE — Patient Instructions (Signed)
Trazodone at bedtime as needed for sleep  Health Maintenance, Male Adopting a healthy lifestyle and getting preventive care are important in promoting health and wellness. Ask your health care provider about: The right schedule for you to have regular tests and exams. Things you can do on your own to prevent diseases and keep yourself healthy. What should I know about diet, weight, and exercise? Eat a healthy diet  Eat a diet that includes plenty of vegetables, fruits, low-fat dairy products, and lean protein. Do not eat a lot of foods that are high in solid fats, added sugars, or sodium. Maintain a healthy weight Body mass index (BMI) is a measurement that can be used to identify possible weight problems. It estimates body fat based on height and weight. Your health care provider can help determine your BMI and help you achieve or maintain a healthy weight. Get regular exercise Get regular exercise. This is one of the most important things you can do for your health. Most adults should: Exercise for at least 150 minutes each week. The exercise should increase your heart rate and make you sweat (moderate-intensity exercise). Do strengthening exercises at least twice a week. This is in addition to the moderate-intensity exercise. Spend less time sitting. Even light physical activity can be beneficial. Watch cholesterol and blood lipids Have your blood tested for lipids and cholesterol at 65 years of age, then have this test every 5 years. You may need to have your cholesterol levels checked more often if: Your lipid or cholesterol levels are high. You are older than 64 years of age. You are at high risk for heart disease. What should I know about cancer screening? Many types of cancers can be detected early and may often be prevented. Depending on your health history and family history, you may need to have cancer screening at various ages. This may include screening for: Colorectal  cancer. Prostate cancer. Skin cancer. Lung cancer. What should I know about heart disease, diabetes, and high blood pressure? Blood pressure and heart disease High blood pressure causes heart disease and increases the risk of stroke. This is more likely to develop in people who have high blood pressure readings or are overweight. Talk with your health care provider about your target blood pressure readings. Have your blood pressure checked: Every 3-5 years if you are 12-69 years of age. Every year if you are 8 years old or older. If you are between the ages of 34 and 2 and are a current or former smoker, ask your health care provider if you should have a one-time screening for abdominal aortic aneurysm (AAA). Diabetes Have regular diabetes screenings. This checks your fasting blood sugar level. Have the screening done: Once every three years after age 57 if you are at a normal weight and have a low risk for diabetes. More often and at a younger age if you are overweight or have a high risk for diabetes. What should I know about preventing infection? Hepatitis B If you have a higher risk for hepatitis B, you should be screened for this virus. Talk with your health care provider to find out if you are at risk for hepatitis B infection. Hepatitis C Blood testing is recommended for: Everyone born from [birth year] through 1965. Anyone with known risk factors for hepatitis C. Sexually transmitted infections (STIs) You should be screened each year for STIs, including gonorrhea and chlamydia, if: You are sexually active and are younger than 64 years of age. You are  older than 64 years of age and your health care provider tells you that you are at risk for this type of infection. Your sexual activity has changed since you were last screened, and you are at increased risk for chlamydia or gonorrhea. Ask your health care provider if you are at risk. Ask your health care provider about whether you are at  high risk for HIV. Your health care provider may recommend a prescription medicine to help prevent HIV infection. If you choose to take medicine to prevent HIV, you should first get tested for HIV. You should then be tested every 3 months for as long as you are taking the medicine. Follow these instructions at home: Alcohol use Do not drink alcohol if your health care provider tells you not to drink. If you drink alcohol: Limit how much you have to 0-2 drinks a day. Know how much alcohol is in your drink. In the [place] one drink equals one 12 oz bottle of beer (355 mL), one 5 oz glass of wine (148 mL), or one 1 oz glass of hard liquor (44 mL). Lifestyle Do not use any products that contain nicotine or tobacco. These products include cigarettes, chewing tobacco, and vaping devices, such as e-cigarettes. If you need help quitting, ask your health care provider. Do not use street drugs. Do not share needles. Ask your health care provider for help if you need support or information about quitting drugs. General instructions Schedule regular health, dental, and eye exams. Stay current with your vaccines. Tell your health care provider if: You often feel depressed. You have ever been abused or do not feel safe at home. Summary Adopting a healthy lifestyle and getting preventive care are important in promoting health and wellness. Follow your health care provider's instructions about healthy diet, exercising, and getting tested or screened for diseases. Follow your health care provider's instructions on monitoring your cholesterol and blood pressure. This information is not intended to replace advice given to you by your health care provider. Make sure you discuss any questions you have with your health care provider. Document Revised: 03/04/2021 Document Reviewed: 03/04/2021 Elsevier Patient Education  2024 [employer].

## 2023-04-14 NOTE — Progress Notes (Signed)
Complete physical exam  Patient: Joel Johnston   DOB: Apr 23, 1961   62 y.o. Male  MRN: [id number]  Subjective:    Chief Complaint  Patient presents with   Annual Exam    Joel Johnston is a 62 y.o. male who presents today for a complete physical exam. He reports consuming a general diet.  Pt is very active at work and walks the entire 8hr shift.  He generally feels well. He reports sleeping poorly. He does have additional problems to discuss today. He has trouble going to sleep but not staying asleep. He works 1st, 2nd and 3rd shift so hard to set routine.    Most recent fall risk assessment:    04/14/2023    8:27 AM  Fall Risk   Falls in the past year? 0  Number falls in past yr: 0  Injury with Fall? 0  Risk for fall due to : No Fall Risks  Follow up Falls evaluation completed     Most recent depression screenings:    04/14/2023    8:27 AM 09/17/2022    9:45 AM  PHQ 2/9 Scores  PHQ - 2 Score 0 0    Vision:Within last year and Dental: No current dental problems  Patient Active Problem List   Diagnosis Date Noted   Primary insomnia 04/14/2023   Dyslipidemia (high LDL; low HDL) 04/14/2023   Polycythemia 04/14/2023   Lumbar spondylosis 01/24/2022   Chronic right shoulder pain 12/13/2021   Chronic pain of right hip 12/13/2021   Erectile dysfunction 01/30/2021   Prediabetes 01/18/2020   Bilateral primary osteoarthritis of knee 01/17/2020   Asymptomatic hypertensive urgency 07/14/2016   Essential hypertension, benign 02/21/2011   Past Medical History:  Diagnosis Date   Hyperplastic polyps of stomach    Hypertension    History reviewed. No pertinent surgical history. Family History  Problem Relation Age of Onset   Cancer Mother    No Known Allergies    Patient Care Team: Nolene Ebbs as PCP - General (Family Medicine)   Outpatient Medications Prior to Visit  Medication Sig   acetaminophen (TYLENOL) 650 MG CR tablet Take 1 tablet (650 mg total) by mouth  in the morning and at bedtime.   amLODipine (NORVASC) 10 MG tablet TAKE 1 TABLET BY MOUTH EVERY DAY   atorvastatin (LIPITOR) 40 MG tablet Take 1 tablet (40 mg total) by mouth daily.   diclofenac (VOLTAREN) 75 MG EC tablet TAKE 1 TABLET BY MOUTH TWICE A DAY   diclofenac Sodium (VOLTAREN) 1 % GEL APPLY 4 GRAM TOPICALLY 4 TIMES DAILY. TO AFFECTED JOINT.   losartan-hydrochlorothiazide (HYZAAR) 100-25 MG tablet TAKE 1 TABLET DAILY   sildenafil (VIAGRA) 100 MG tablet TAKE 1/2 - 1 TABLET BY MOUTH DAILY AS NEEDED FOR ERECTILE DYSFUNCTION   No facility-administered medications prior to visit.    ROS  See HPI.      Objective:     BP (!) 140/100   Pulse 83   Ht 5\' 7"  (1.702 m)   Wt 185 lb (83.9 kg)   SpO2 97%   BMI 28.98 kg/m  BP Readings from Last 3 Encounters:  04/14/23 (!) 140/100  09/17/22 (!) 146/90  10/25/21 (!) 144/97   Wt Readings from Last 3 Encounters:  04/14/23 185 lb (83.9 kg)  09/17/22 185 lb (83.9 kg)  10/25/21 181 lb (82.1 kg)      Physical Exam  BP (!) 140/100   Pulse 83   Ht 5\' 7"  (1.702 m)  Wt 185 lb (83.9 kg)   SpO2 97%   BMI 28.98 kg/m   General Appearance:    Alert, cooperative, no distress, appears stated age  Head:    Normocephalic, without obvious abnormality, atraumatic  Eyes:    PERRL, conjunctiva/corneas clear, EOM's intact, fundi    benign, both eyes       Ears:    Normal TM's and external ear canals, both ears  Nose:   Nares normal, septum midline, mucosa normal, no drainage    or sinus tenderness  Throat:   Lips, mucosa, and tongue normal; teeth and gums normal  Neck:   Supple, symmetrical, trachea midline, no adenopathy;       thyroid:  No enlargement/tenderness/nodules; no carotid   bruit or JVD  Back:     Symmetric, no curvature, ROM normal, no CVA tenderness  Lungs:     Clear to auscultation bilaterally, respirations unlabored  Chest wall:    No tenderness or deformity  Heart:    Regular rate and rhythm, S1 and S2 normal, no murmur,  rub   or gallop  Abdomen:     Soft, non-tender, bowel sounds active all four quadrants,    no masses, no organomegaly        Extremities:   Extremities normal, atraumatic, no cyanosis or edema  Pulses:   2+ and symmetric all extremities  Skin:   Skin color, texture, turgor normal, no rashes or lesions  Lymph nodes:   Cervical, supraclavicular, and axillary nodes normal  Neurologic:   CNII-XII intact. Normal strength, sensation and reflexes      throughout      Assessment & Plan:    Routine Health Maintenance and Physical Exam  Immunization History  Administered Date(s) Administered   Influenza,inj,Quad PF,6+ Mos 08/21/2021, 09/17/2022   PFIZER(Purple Top)SARS-COV-2 Vaccination 12/31/2019, 01/31/2020, 08/01/2020   Pfizer Covid-19 Vaccine Bivalent Booster 42yrs & up 12/23/2021   Tdap 05/02/2011, 10/25/2021   Zoster Recombinat (Shingrix) 01/30/2021, 08/21/2021    Health Maintenance  Topic Date Due   COVID-19 Vaccine (5 - 2023-24 season) 04/30/2023 (Originally 06/27/2022)   HIV Screening  09/18/2023 (Originally 09/20/1974)   INFLUENZA VACCINE  05/28/2023   Colonoscopy  03/24/2029   DTaP/Tdap/Td (3 - Td or Tdap) 10/26/2031   Hepatitis C Screening  Completed   Zoster Vaccines- Shingrix  Completed   HPV VACCINES  Aged Out    Discussed health benefits of physical activity, and encouraged him to engage in regular exercise appropriate for his age and condition.  Marland KitchenMarland KitchenMarland KitchenSammy was seen today for annual exam.  Diagnoses and all orders for this visit:  Routine physical examination -     PSA -     TSH -     Lipid Panel w/reflex Direct LDL -     COMPLETE METABOLIC PANEL WITH GFR -     CBC with Differential/Platelet  Polycythemia -     CBC with Differential/Platelet  Essential hypertension, benign -     COMPLETE METABOLIC PANEL WITH GFR  Dyslipidemia (high LDL; low HDL) -     Lipid Panel w/reflex Direct LDL  Screening PSA (prostate specific antigen) -     PSA  Thyroid disorder  screen -     TSH  Screening for diabetes mellitus -     COMPLETE METABOLIC PANEL WITH GFR  Primary insomnia -     traZODone (DESYREL) 50 MG tablet; Take 0.5-1 tablets (25-50 mg total) by mouth at bedtime as needed for sleep.   Marland Kitchen.Start a regular  exercise program and make sure you are eating a healthy diet Try to eat 4 servings of dairy a day or take a calcium supplement (500mg  twice a day). Your vaccines are up to date.  Fasting labs ordered No concerns with PHQ BP not to goal-recheck in 2 weeks nurse visit Colonoscopy UTD Vaccines UTD Discussed sleep-trial of trazodone Discussed side effects       Tandy Gaw, PA-C

## 2023-04-15 LAB — COMPLETE METABOLIC PANEL WITH GFR
AG Ratio: 1.8 (calc) (ref 1.0–2.5)
AST: 22 U/L (ref 10–35)
Alkaline phosphatase (APISO): 135 U/L (ref 35–144)
CO2: 28 mmol/L (ref 20–32)
Creat: 0.88 mg/dL (ref 0.70–1.35)
Globulin: 2.5 g/dL (calc) (ref 1.9–3.7)
Sodium: 139 mmol/L (ref 135–146)

## 2023-04-15 LAB — LIPID PANEL W/REFLEX DIRECT LDL
Cholesterol: 84 mg/dL (ref <200)
LDL Cholesterol (Calc): 30 mg/dL (calc)
Total CHOL/HDL Ratio: 2.1 (calc) (ref <5.0)

## 2023-04-15 LAB — CBC WITH DIFFERENTIAL/PLATELET
Eosinophils Relative: 6.3 %
Lymphs Abs: 2120 cells/uL (ref 850–3900)
MCHC: 33.7 g/dL (ref 32.0–36.0)
Platelets: 207 10*3/uL (ref 140–400)
RBC: 5.31 10*6/uL (ref 4.20–5.80)

## 2023-04-15 LAB — PSA: PSA: 2 ng/mL (ref <=4.00)

## 2023-04-15 NOTE — Progress Notes (Signed)
Henrick,   Labs look really good except for slightly elevated fasting sugar. Will add A1C to better evaluate.

## 2023-04-17 LAB — COMPLETE METABOLIC PANEL WITH GFR
ALT: 31 U/L (ref 9–46)
Albumin: 4.4 g/dL (ref 3.6–5.1)
BUN: 16 mg/dL (ref 7–25)
Calcium: 9 mg/dL (ref 8.6–10.3)
Chloride: 104 mmol/L (ref 98–110)
Glucose, Bld: 105 mg/dL — ABNORMAL HIGH (ref 65–99)
Potassium: 4.1 mmol/L (ref 3.5–5.3)
Total Bilirubin: 0.6 mg/dL (ref 0.2–1.2)
Total Protein: 6.9 g/dL (ref 6.1–8.1)
eGFR: 97 mL/min/{1.73_m2} (ref >=60)

## 2023-04-17 LAB — CBC WITH DIFFERENTIAL/PLATELET
Absolute Monocytes: 694 cells/uL (ref 200–950)
Basophils Absolute: 50 cells/uL (ref 0–200)
HCT: 46.6 % (ref 38.5–50.0)
Hemoglobin: 15.7 g/dL (ref 13.2–17.1)
MCH: 29.6 pg (ref 27.0–33.0)
MCV: 87.8 fL (ref 80.0–100.0)
Neutro Abs: 2945 cells/uL (ref 1500–7800)
Neutrophils Relative %: 47.5 %
RDW: 13.8 % (ref 11.0–15.0)
Total Lymphocyte: 34.2 %

## 2023-04-17 LAB — LIPID PANEL W/REFLEX DIRECT LDL
HDL: 40 mg/dL (ref >=40)
Non-HDL Cholesterol (Calc): 44 mg/dL (calc) (ref <130)
Triglycerides: 61 mg/dL (ref <150)

## 2023-04-17 LAB — HEMOGLOBIN A1C W/OUT EAG: Hgb A1c MFr Bld: 6.2 % of total Hgb — ABNORMAL HIGH (ref <5.7)

## 2023-04-17 LAB — TSH: TSH: 2.59 mIU/L (ref 0.40–4.50)

## 2023-04-20 NOTE — Progress Notes (Signed)
A1C has worsened. Still in pre-diabetes range. Continue to limit sugars and carbs and get 150 minutes of exercise a week. Recheck in 6 months. You could consider starting metformin as well. Thoughts?

## 2023-04-21 MED ORDER — METFORMIN HCL 500 MG PO TABS: 500.0000 mg | ORAL_TABLET | Freq: Two times a day (BID) | ORAL | 1 refills | Status: DC

## 2023-04-21 NOTE — Addendum Note (Signed)
Addended by: Jomarie Longs on: 04/21/2023 07:09 AM   Modules accepted: Orders

## 2023-04-28 ENCOUNTER — Ambulatory Visit (INDEPENDENT_AMBULATORY_CARE_PROVIDER_SITE_OTHER): Payer: BC Managed Care – PPO | Admitting: Physician Assistant

## 2023-04-28 VITALS — BP 118/76 | HR 92 | Ht 67.0 in | Wt 179.0 lb

## 2023-04-28 DIAGNOSIS — I10 Essential (primary) hypertension: Secondary | ICD-10-CM

## 2023-04-28 NOTE — Progress Notes (Signed)
GREAT BP. Continue same dose and follow up in 6 months.

## 2023-04-28 NOTE — Progress Notes (Signed)
Patient is here for blood pressure check.   Previous BP was 140/100  1st BP today: 118/76  Denies chest pain, dizziness, shortness of breath, severe headache, or nosebleeds.  Taking medication as prescribed. Denies missed doses.

## 2023-05-06 ENCOUNTER — Other Ambulatory Visit: Payer: Self-pay | Admitting: Physician Assistant

## 2023-05-06 DIAGNOSIS — F5101 Primary insomnia: Secondary | ICD-10-CM

## 2023-07-15 ENCOUNTER — Ambulatory Visit: Payer: BC Managed Care – PPO | Admitting: Physician Assistant

## 2023-07-17 ENCOUNTER — Encounter: Payer: Self-pay | Admitting: Physician Assistant

## 2023-07-17 ENCOUNTER — Ambulatory Visit: Payer: BC Managed Care – PPO | Admitting: Physician Assistant

## 2023-07-17 VITALS — BP 132/82 | HR 82 | Ht 67.0 in | Wt 178.5 lb

## 2023-07-17 DIAGNOSIS — E785 Hyperlipidemia, unspecified: Secondary | ICD-10-CM

## 2023-07-17 DIAGNOSIS — Z23 Encounter for immunization: Secondary | ICD-10-CM

## 2023-07-17 DIAGNOSIS — I10 Essential (primary) hypertension: Secondary | ICD-10-CM | POA: Diagnosis not present

## 2023-07-17 DIAGNOSIS — F5101 Primary insomnia: Secondary | ICD-10-CM | POA: Diagnosis not present

## 2023-07-17 DIAGNOSIS — M2550 Pain in unspecified joint: Secondary | ICD-10-CM | POA: Insufficient documentation

## 2023-07-17 MED ORDER — ATORVASTATIN CALCIUM 40 MG PO TABS
40.0000 mg | ORAL_TABLET | Freq: Every day | ORAL | 3 refills | Status: DC
Start: 2023-07-17 — End: 2024-04-18

## 2023-07-17 MED ORDER — LOSARTAN POTASSIUM-HCTZ 100-25 MG PO TABS
ORAL_TABLET | ORAL | 1 refills | Status: DC
Start: 2023-07-17 — End: 2024-03-03

## 2023-07-17 MED ORDER — AMLODIPINE BESYLATE 10 MG PO TABS
10.0000 mg | ORAL_TABLET | Freq: Every day | ORAL | 1 refills | Status: DC
Start: 2023-07-17 — End: 2024-03-07

## 2023-07-17 MED ORDER — NAPROXEN 500 MG PO TABS
500.0000 mg | ORAL_TABLET | Freq: Two times a day (BID) | ORAL | 5 refills | Status: AC
Start: 2023-07-17 — End: 2024-07-16

## 2023-07-17 NOTE — Progress Notes (Signed)
Established Patient Office Visit  Subjective   Patient ID: Joel Johnston, male    DOB: 08/16/1961  Age: 62 y.o. MRN: [id number]  No chief complaint on file.   HPI Pt is a 62 yo male with HTN and insomnia who presents to the clinic to follow up.   Patient is taking Norvasc and Hyzaar daily.  He denies any chest pains, palpitations, shortness of breath, vision changes.  He is not checking his blood pressure at home.  He continues to have issues with falling asleep.  He took trazodone 50 mg and did not see much improvement.  He has not tried anything else or any other dosages.  He has a history of osteoarthritis and he can tell with the weather change his joints are aching more more.  He does not feel like the diclofenac helps at all.  He does use the Voltaren gel sometimes with some minimal improvement.  The medication over-the-counter that helps the most is Aleve.  .. Active Ambulatory Problems    Diagnosis Date Noted   Essential hypertension, benign 02/21/2011   Asymptomatic hypertensive urgency 07/14/2016   Bilateral primary osteoarthritis of knee 01/17/2020   Prediabetes 01/18/2020   Erectile dysfunction 01/30/2021   Chronic right shoulder pain 12/13/2021   Chronic pain of right hip 12/13/2021   Lumbar spondylosis 01/24/2022   Primary insomnia 04/14/2023   Dyslipidemia (high LDL; low HDL) 04/14/2023   Polycythemia 04/14/2023   Pain in joint involving multiple sites 07/17/2023   Resolved Ambulatory Problems    Diagnosis Date Noted   Chronic pain of left knee 12/01/2018   Past Medical History:  Diagnosis Date   Hyperplastic polyps of stomach    Hypertension     ROS See HPI.    Objective:     BP 132/82   Pulse 82   Ht 5\' 7"  (1.702 m)   Wt 178 lb 8 oz (81 kg)   SpO2 97%   BMI 27.96 kg/m  BP Readings from Last 3 Encounters:  07/17/23 132/82  04/28/23 118/76  04/14/23 (!) 140/100   Wt Readings from Last 3 Encounters:  07/17/23 178 lb 8 oz (81 kg)  04/28/23  179 lb (81.2 kg)  04/14/23 185 lb (83.9 kg)   .    04/14/2023    8:27 AM 09/17/2022    9:45 AM 01/30/2021    8:58 AM 01/16/2020    3:02 PM 12/01/2018    7:55 AM  Depression screen PHQ 2/9  Decreased Interest 0 0 0 0 0  Down, Depressed, Hopeless 0 0 0 0 0  PHQ - 2 Score 0 0 0 0 0  Altered sleeping    0 0  Tired, decreased energy    0 0  Change in appetite    0 0  Feeling bad or failure about yourself     0   Trouble concentrating    0 0  Moving slowly or fidgety/restless    0 0  Suicidal thoughts    0 0  PHQ-9 Score    0 0  Difficult doing work/chores    Not difficult at all Not difficult at all      Physical Exam Constitutional:      Appearance: Normal appearance.  HENT:     Head: Normocephalic.  Cardiovascular:     Rate and Rhythm: Normal rate and regular rhythm.     Heart sounds: Normal heart sounds.  Pulmonary:     Effort: Pulmonary effort is normal.  Breath sounds: Normal breath sounds.  Neurological:     General: No focal deficit present.     Mental Status: He is alert and oriented to person, place, and time.  Psychiatric:        Mood and Affect: Mood normal.        Assessment & Plan:  Marland KitchenMarland KitchenDiagnoses and all orders for this visit:  Essential hypertension, benign -     amLODipine (NORVASC) 10 MG tablet; Take 1 tablet (10 mg total) by mouth daily. -     losartan-hydrochlorothiazide (HYZAAR) 100-25 MG tablet; TAKE 1 TABLET DAILY  Primary insomnia  Pain in joint involving multiple sites -     naproxen (NAPROSYN) 500 MG tablet; Take 1 tablet (500 mg total) by mouth 2 (two) times daily with a meal.  Dyslipidemia (high LDL; low HDL) -     atorvastatin (LIPITOR) 40 MG tablet; Take 1 tablet (40 mg total) by mouth daily.   BP to goal on 2nd check Refilled norvasc and Hyzaar Refilled lipitor  Labs UTD Increase trazodone to 100mg  at bedtime for sleep  Ok to go up to 150mg  Discussed joint pain On tumeric Sent naproxen Consider cymbalta/lyrica if pain not  improving Flu and covid vaccine given today   Tandy Gaw, PA-C

## 2023-07-17 NOTE — Patient Instructions (Addendum)
Increase trazodone to 100mg  at bedtime for sleep Can increase up to 150mg    Naproxen for pain twice a day Consider cymbalta or lyrica as well if not improving

## 2023-10-11 ENCOUNTER — Other Ambulatory Visit: Payer: Self-pay | Admitting: Physician Assistant

## 2023-12-23 DIAGNOSIS — H25813 Combined forms of age-related cataract, bilateral: Secondary | ICD-10-CM | POA: Diagnosis not present

## 2023-12-23 DIAGNOSIS — H5203 Hypermetropia, bilateral: Secondary | ICD-10-CM | POA: Diagnosis not present

## 2023-12-23 DIAGNOSIS — H524 Presbyopia: Secondary | ICD-10-CM | POA: Diagnosis not present

## 2024-01-10 ENCOUNTER — Other Ambulatory Visit: Payer: Self-pay | Admitting: Physician Assistant

## 2024-03-03 ENCOUNTER — Other Ambulatory Visit: Payer: Self-pay | Admitting: Physician Assistant

## 2024-03-03 DIAGNOSIS — I10 Essential (primary) hypertension: Secondary | ICD-10-CM

## 2024-03-05 ENCOUNTER — Other Ambulatory Visit: Payer: Self-pay | Admitting: Physician Assistant

## 2024-03-05 DIAGNOSIS — I10 Essential (primary) hypertension: Secondary | ICD-10-CM

## 2024-04-11 ENCOUNTER — Other Ambulatory Visit: Payer: Self-pay | Admitting: Physician Assistant

## 2024-04-11 DIAGNOSIS — I10 Essential (primary) hypertension: Secondary | ICD-10-CM

## 2024-04-15 ENCOUNTER — Ambulatory Visit (INDEPENDENT_AMBULATORY_CARE_PROVIDER_SITE_OTHER): Payer: BC Managed Care – PPO | Admitting: Physician Assistant

## 2024-04-15 ENCOUNTER — Encounter: Payer: Self-pay | Admitting: Physician Assistant

## 2024-04-15 VITALS — BP 128/80 | HR 81 | Ht 67.0 in | Wt 179.0 lb

## 2024-04-15 DIAGNOSIS — Z0000 Encounter for general adult medical examination without abnormal findings: Secondary | ICD-10-CM

## 2024-04-15 DIAGNOSIS — N529 Male erectile dysfunction, unspecified: Secondary | ICD-10-CM

## 2024-04-15 DIAGNOSIS — Z125 Encounter for screening for malignant neoplasm of prostate: Secondary | ICD-10-CM

## 2024-04-15 DIAGNOSIS — D751 Secondary polycythemia: Secondary | ICD-10-CM | POA: Diagnosis not present

## 2024-04-15 DIAGNOSIS — R7303 Prediabetes: Secondary | ICD-10-CM | POA: Diagnosis not present

## 2024-04-15 DIAGNOSIS — E785 Hyperlipidemia, unspecified: Secondary | ICD-10-CM

## 2024-04-15 DIAGNOSIS — I10 Essential (primary) hypertension: Secondary | ICD-10-CM | POA: Diagnosis not present

## 2024-04-15 DIAGNOSIS — Z79899 Other long term (current) drug therapy: Secondary | ICD-10-CM | POA: Diagnosis not present

## 2024-04-15 DIAGNOSIS — F5101 Primary insomnia: Secondary | ICD-10-CM | POA: Diagnosis not present

## 2024-04-15 MED ORDER — LOSARTAN POTASSIUM-HCTZ 100-25 MG PO TABS: 1.0000 | ORAL_TABLET | Freq: Every day | ORAL | 1 refills | Status: AC

## 2024-04-15 MED ORDER — SILDENAFIL CITRATE 100 MG PO TABS: 100.0000 mg | ORAL_TABLET | ORAL | 5 refills | Status: AC | PRN

## 2024-04-15 MED ORDER — METFORMIN HCL 500 MG PO TABS: 500.0000 mg | ORAL_TABLET | Freq: Two times a day (BID) | ORAL | 1 refills | Status: DC

## 2024-04-15 NOTE — Patient Instructions (Signed)
 Health Maintenance, Male  Adopting a healthy lifestyle and getting preventive care are important in promoting health and wellness. Ask your health care provider about:  The right schedule for you to have regular tests and exams.  Things you can do on your own to prevent diseases and keep yourself healthy.  What should I know about diet, weight, and exercise?  Eat a healthy diet    Eat a diet that includes plenty of vegetables, fruits, low-fat dairy products, and lean protein.  Do not eat a lot of foods that are high in solid fats, added sugars, or sodium.  Maintain a healthy weight  Body mass index (BMI) is a measurement that can be used to identify possible weight problems. It estimates body fat based on height and weight. Your health care provider can help determine your BMI and help you achieve or maintain a healthy weight.  Get regular exercise  Get regular exercise. This is one of the most important things you can do for your health. Most adults should:  Exercise for at least 150 minutes each week. The exercise should increase your heart rate and make you sweat (moderate-intensity exercise).  Do strengthening exercises at least twice a week. This is in addition to the moderate-intensity exercise.  Spend less time sitting. Even light physical activity can be beneficial.  Watch cholesterol and blood lipids  Have your blood tested for lipids and cholesterol at 65 years of age, then have this test every 5 years.  You may need to have your cholesterol levels checked more often if:  Your lipid or cholesterol levels are high.  You are older than 65 years of age.  You are at high risk for heart disease.  What should I know about cancer screening?  Many types of cancers can be detected early and may often be prevented. Depending on your health history and family history, you may need to have cancer screening at various ages. This may include screening for:  Colorectal cancer.  Prostate cancer.  Skin cancer.  Lung  cancer.  What should I know about heart disease, diabetes, and high blood pressure?  Blood pressure and heart disease  High blood pressure causes heart disease and increases the risk of stroke. This is more likely to develop in people who have high blood pressure readings or are overweight.  Talk with your health care provider about your target blood pressure readings.  Have your blood pressure checked:  Every 3-5 years if you are 9-95 years of age.  Every year if you are 85 years old or older.  If you are between the ages of 29 and 29 and are a current or former smoker, ask your health care provider if you should have a one-time screening for abdominal aortic aneurysm (AAA).  Diabetes  Have regular diabetes screenings. This checks your fasting blood sugar level. Have the screening done:  Once every three years after age 23 if you are at a normal weight and have a low risk for diabetes.  More often and at a younger age if you are overweight or have a high risk for diabetes.  What should I know about preventing infection?  Hepatitis B  If you have a higher risk for hepatitis B, you should be screened for this virus. Talk with your health care provider to find out if you are at risk for hepatitis B infection.  Hepatitis C  Blood testing is recommended for:  Everyone born from 30 through 1965.  Anyone  with known risk factors for hepatitis C.  Sexually transmitted infections (STIs)  You should be screened each year for STIs, including gonorrhea and chlamydia, if:  You are sexually active and are younger than 65 years of age.  You are older than 65 years of age and your health care provider tells you that you are at risk for this type of infection.  Your sexual activity has changed since you were last screened, and you are at increased risk for chlamydia or gonorrhea. Ask your health care provider if you are at risk.  Ask your health care provider about whether you are at high risk for HIV. Your health care provider  may recommend a prescription medicine to help prevent HIV infection. If you choose to take medicine to prevent HIV, you should first get tested for HIV. You should then be tested every 3 months for as long as you are taking the medicine.  Follow these instructions at home:  Alcohol use  Do not drink alcohol if your health care provider tells you not to drink.  If you drink alcohol:  Limit how much you have to 0-2 drinks a day.  Know how much alcohol is in your drink. In the [place] one drink equals one 12 oz bottle of beer (355 mL), one 5 oz glass of wine (148 mL), or one 1 oz glass of hard liquor (44 mL).  Lifestyle  Do not use any products that contain nicotine or tobacco. These products include cigarettes, chewing tobacco, and vaping devices, such as e-cigarettes. If you need help quitting, ask your health care provider.  Do not use street drugs.  Do not share needles.  Ask your health care provider for help if you need support or information about quitting drugs.  General instructions  Schedule regular health, dental, and eye exams.  Stay current with your vaccines.  Tell your health care provider if:  You often feel depressed.  You have ever been abused or do not feel safe at home.  Summary  Adopting a healthy lifestyle and getting preventive care are important in promoting health and wellness.  Follow your health care provider's instructions about healthy diet, exercising, and getting tested or screened for diseases.  Follow your health care provider's instructions on monitoring your cholesterol and blood pressure.  This information is not intended to replace advice given to you by your health care provider. Make sure you discuss any questions you have with your health care provider.  Document Revised: 03/04/2021 Document Reviewed: 03/04/2021  Elsevier Patient Education  2024 [employer].

## 2024-04-15 NOTE — Progress Notes (Unsigned)
 Complete physical exam  Patient: Joel Johnston   DOB: 1961/10/14   63 y.o. Male  MRN: [id number]  Subjective:    Chief Complaint  Patient presents with   Annual Exam    Joel Johnston is a 63 y.o. male who presents today for a complete physical exam. He reports consuming a general diet. Pt is active doing stuff around the house but does not exercise.  He generally feels well. He reports sleeping poorly. He does not have additional problems to discuss today.    Most recent fall risk assessment:    04/14/2023    8:27 AM  Fall Risk   Falls in the past year? 0  Number falls in past yr: 0  Injury with Fall? 0  Risk for fall due to : No Fall Risks  Follow up Falls evaluation completed     Most recent depression screenings:    04/15/2024   11:30 AM 04/14/2023    8:27 AM  PHQ 2/9 Scores  PHQ - 2 Score 0 0  PHQ- 9 Score 0     Vision:Within last year, Dental: No current dental problems and Receives regular dental care, and PSA: Agrees to PSA testing  Patient Active Problem List   Diagnosis Date Noted   Pain in joint involving multiple sites 07/17/2023   Primary insomnia 04/14/2023   Dyslipidemia (high LDL; low HDL) 04/14/2023   Polycythemia 04/14/2023   Lumbar spondylosis 01/24/2022   Chronic right shoulder pain 12/13/2021   Chronic pain of right hip 12/13/2021   Erectile dysfunction 01/30/2021   Prediabetes 01/18/2020   Bilateral primary osteoarthritis of knee 01/17/2020   Asymptomatic hypertensive urgency 07/14/2016   Essential hypertension, benign 02/21/2011   Past Medical History:  Diagnosis Date   Hyperplastic polyps of stomach    Hypertension    No past surgical history on file. Family History  Problem Relation Age of Onset   Cancer Mother    No Known Allergies    Patient Care Team: Gurpreet Mariani L, PA-C as PCP - General (Family Medicine)   Outpatient Medications Prior to Visit  Medication Sig   acetaminophen  (TYLENOL ) 650 MG CR tablet Take 1 tablet (650  mg total) by mouth in the morning and at bedtime.   amLODipine  (NORVASC ) 10 MG tablet TAKE 1 TABLET (10 MG TOTAL) BY MOUTH DAILY. NEEDS APPOINTMENT.   atorvastatin  (LIPITOR) 40 MG tablet Take 1 tablet (40 mg total) by mouth daily.   diclofenac  Sodium (VOLTAREN ) 1 % GEL APPLY 4 GRAM TOPICALLY 4 TIMES DAILY. TO AFFECTED JOINT.   naproxen  (NAPROSYN ) 500 MG tablet Take 1 tablet (500 mg total) by mouth 2 (two) times daily with a meal.   traZODone  (DESYREL ) 50 MG tablet TAKE 0.5-1 TABLETS BY MOUTH AT BEDTIME AS NEEDED FOR SLEEP.   [DISCONTINUED] losartan -hydrochlorothiazide (HYZAAR) 100-25 MG tablet TAKE 1 TABLET DAILY   [DISCONTINUED] metFORMIN  (GLUCOPHAGE ) 500 MG tablet TAKE 1 TABLET BY MOUTH TWICE A DAY WITH FOOD   [DISCONTINUED] sildenafil  (VIAGRA ) 100 MG tablet TAKE 1/2 - 1 TABLET BY MOUTH DAILY AS NEEDED FOR ERECTILE DYSFUNCTION   No facility-administered medications prior to visit.    ROS        Objective:     BP 128/80   Pulse 81   Ht 5' 7 (1.702 m)   Wt 179 lb (81.2 kg)   SpO2 99%   BMI 28.04 kg/m  BP Readings from Last 3 Encounters:  04/15/24 128/80  07/17/23 132/82  04/28/23 118/76   Wt  Readings from Last 3 Encounters:  04/15/24 179 lb (81.2 kg)  07/17/23 178 lb 8 oz (81 kg)  04/28/23 179 lb (81.2 kg)      Physical Exam      Assessment & Plan:    Routine Health Maintenance and Physical Exam  Immunization History  Administered Date(s) Administered   Influenza, Seasonal, Injecte, Preservative Fre 07/17/2023   Influenza,inj,Quad PF,6+ Mos 08/21/2021, 09/17/2022   PFIZER(Purple Top)SARS-COV-2 Vaccination 12/31/2019, 01/31/2020, 08/01/2020   Pfizer Covid-19 Vaccine Bivalent Booster 42yrs & up 12/23/2021   Pfizer(Comirnaty)Fall Seasonal Vaccine 12 years and older 07/17/2023   Tdap 05/02/2011, 10/25/2021   Zoster Recombinant(Shingrix) 01/30/2021, 08/21/2021    Health Maintenance  Topic Date Due   HIV Screening  Never done   COVID-19 Vaccine (6 - Pfizer  risk 2024-25 season) 01/14/2024   INFLUENZA VACCINE  05/27/2024   Colonoscopy  03/24/2029   DTaP/Tdap/Td (3 - Td or Tdap) 10/26/2031   Hepatitis C Screening  Completed   Zoster Vaccines- Shingrix  Completed   HPV VACCINES  Aged Out   Meningococcal B Vaccine  Aged Out    Discussed health benefits of physical activity, and encouraged him to engage in regular exercise appropriate for his age and condition. Joel Johnston was seen today for annual exam.  Diagnoses and all orders for this visit:  Routine physical examination -     CBC with Differential/Platelet -     CMP14+EGFR -     Lipid panel -     PSA, total and free -     TSH -     Hemoglobin A1c  Essential hypertension, benign -     CMP14+EGFR -     losartan -hydrochlorothiazide (HYZAAR) 100-25 MG tablet; Take 1 tablet by mouth daily.  Primary insomnia  Dyslipidemia (high LDL; low HDL) -     Lipid panel  Screening PSA (prostate specific antigen)  Polycythemia -     CBC with Differential/Platelet  Prediabetes -     CMP14+EGFR -     Hemoglobin A1c  Medication management -     CBC with Differential/Platelet -     CMP14+EGFR -     Lipid panel -     PSA, total and free -     TSH -     Hemoglobin A1c  Erectile dysfunction, unspecified erectile dysfunction type -     sildenafil  (VIAGRA ) 100 MG tablet; Take 1 tablet (100 mg total) by mouth as needed for erectile dysfunction. TAKE 1/2 - 1 TABLET BY MOUTH DAILY AS NEEDED FOR ERECTILE DYSFUNCTION  Other orders -     metFORMIN  (GLUCOPHAGE ) 500 MG tablet; Take 1 tablet (500 mg total) by mouth 2 (two) times daily with a meal.    Pt declined covid booster and HIV testing.   Return in about 6 months (around 10/15/2024).     Martice Doty, PA-C

## 2024-04-16 LAB — LIPID PANEL
Chol/HDL Ratio: 2 ratio (ref 0.0–5.0)
Cholesterol, Total: 96 mg/dL — ABNORMAL LOW (ref 100–199)
HDL: 49 mg/dL (ref >39)
LDL Chol Calc (NIH): 36 mg/dL (ref 0–99)
Triglycerides: 36 mg/dL (ref 0–149)
VLDL Cholesterol Cal: 11 mg/dL (ref 5–40)

## 2024-04-16 LAB — CBC WITH DIFFERENTIAL/PLATELET
Basophils Absolute: 0.1 10*3/uL (ref 0.0–0.2)
Basos: 1 %
EOS (ABSOLUTE): 0.3 10*3/uL (ref 0.0–0.4)
Eos: 5 %
Hematocrit: 47.8 % (ref 37.5–51.0)
Hemoglobin: 15.5 g/dL (ref 13.0–17.7)
Immature Grans (Abs): 0 10*3/uL (ref 0.0–0.1)
Immature Granulocytes: 0 %
Lymphocytes Absolute: 1.9 10*3/uL (ref 0.7–3.1)
Lymphs: 27 %
MCH: 29.4 pg (ref 26.6–33.0)
MCHC: 32.4 g/dL (ref 31.5–35.7)
MCV: 91 fL (ref 79–97)
Monocytes Absolute: 0.7 10*3/uL (ref 0.1–0.9)
Monocytes: 10 %
Neutrophils Absolute: 4.1 10*3/uL (ref 1.4–7.0)
Neutrophils: 57 %
Platelets: 227 10*3/uL (ref 150–450)
RBC: 5.28 x10E6/uL (ref 4.14–5.80)
RDW: 14.1 % (ref 11.6–15.4)
WBC: 7.1 10*3/uL (ref 3.4–10.8)

## 2024-04-16 LAB — CMP14+EGFR
ALT: 18 IU/L (ref 0–44)
AST: 17 IU/L (ref 0–40)
Albumin: 4.2 g/dL (ref 3.9–4.9)
Alkaline Phosphatase: 144 IU/L — ABNORMAL HIGH (ref 44–121)
BUN/Creatinine Ratio: 23 (ref 10–24)
BUN: 21 mg/dL (ref 8–27)
Bilirubin Total: 0.2 mg/dL (ref 0.0–1.2)
CO2: 19 mmol/L — ABNORMAL LOW (ref 20–29)
Calcium: 9.1 mg/dL (ref 8.6–10.2)
Chloride: 105 mmol/L (ref 96–106)
Creatinine, Ser: 0.9 mg/dL (ref 0.76–1.27)
Globulin, Total: 2.1 g/dL (ref 1.5–4.5)
Glucose: 113 mg/dL — ABNORMAL HIGH (ref 70–99)
Potassium: 3.7 mmol/L (ref 3.5–5.2)
Sodium: 142 mmol/L (ref 134–144)
Total Protein: 6.3 g/dL (ref 6.0–8.5)
eGFR: 95 mL/min/{1.73_m2} (ref >59)

## 2024-04-16 LAB — HEMOGLOBIN A1C
Est. average glucose Bld gHb Est-mCnc: 131 mg/dL
Hgb A1c MFr Bld: 6.2 % — ABNORMAL HIGH (ref 4.8–5.6)

## 2024-04-16 LAB — PSA, TOTAL AND FREE
PSA, Free Pct: 31.8 %
PSA, Free: 0.89 ng/mL
Prostate Specific Ag, Serum: 2.8 ng/mL (ref 0.0–4.0)

## 2024-04-16 LAB — TSH: TSH: 0.801 u[IU]/mL (ref 0.450–4.500)

## 2024-04-18 ENCOUNTER — Ambulatory Visit: Payer: Self-pay | Admitting: Physician Assistant

## 2024-04-18 ENCOUNTER — Encounter: Payer: Self-pay | Admitting: Physician Assistant

## 2024-04-18 MED ORDER — ATORVASTATIN CALCIUM 20 MG PO TABS: 20.0000 mg | ORAL_TABLET | Freq: Every day | ORAL | 3 refills | Status: AC

## 2024-04-18 NOTE — Progress Notes (Signed)
 Joel Johnston,   A1C, average sugars, are stable and in pre-diabetes range.  Continue to work on a diet low in sugars and carbs.  PSA, prostate enzyme, in normal range.  Thyroid looks good.   Cholesterol very low. It is reasonable to cut cholesterol medication in half. I am decreasing lipitor to 20mg  daily.

## 2024-06-30 ENCOUNTER — Encounter: Payer: Self-pay | Admitting: Sports Medicine

## 2024-10-09 ENCOUNTER — Other Ambulatory Visit: Payer: Self-pay | Admitting: Physician Assistant

## 2024-10-09 DIAGNOSIS — I10 Essential (primary) hypertension: Secondary | ICD-10-CM

## 2024-10-09 DIAGNOSIS — R7303 Prediabetes: Secondary | ICD-10-CM

## 2024-10-14 ENCOUNTER — Ambulatory Visit: Admitting: Physician Assistant

## 2024-10-14 ENCOUNTER — Encounter: Payer: Self-pay | Admitting: Physician Assistant

## 2024-10-14 VITALS — BP 129/80 | HR 80 | Ht 67.0 in | Wt 181.0 lb

## 2024-10-14 DIAGNOSIS — N529 Male erectile dysfunction, unspecified: Secondary | ICD-10-CM

## 2024-10-14 DIAGNOSIS — F5101 Primary insomnia: Secondary | ICD-10-CM

## 2024-10-14 DIAGNOSIS — E785 Hyperlipidemia, unspecified: Secondary | ICD-10-CM

## 2024-10-14 DIAGNOSIS — I10 Essential (primary) hypertension: Secondary | ICD-10-CM | POA: Diagnosis not present

## 2024-10-14 DIAGNOSIS — Z23 Encounter for immunization: Secondary | ICD-10-CM

## 2024-10-14 DIAGNOSIS — Z79899 Other long term (current) drug therapy: Secondary | ICD-10-CM | POA: Diagnosis not present

## 2024-10-14 DIAGNOSIS — R7303 Prediabetes: Secondary | ICD-10-CM

## 2024-10-14 LAB — POCT GLYCOSYLATED HEMOGLOBIN (HGB A1C): Hemoglobin A1C: 5.7 % — AB (ref 4.0–5.6)

## 2024-10-14 MED ORDER — AMLODIPINE BESYLATE 10 MG PO TABS: 10.0000 mg | ORAL_TABLET | Freq: Every day | ORAL | 1 refills | Status: AC

## 2024-10-14 MED ORDER — SILDENAFIL CITRATE 100 MG PO TABS: 100.0000 mg | ORAL_TABLET | ORAL | 5 refills | Status: AC | PRN

## 2024-10-14 MED ORDER — METFORMIN HCL 500 MG PO TABS: 500.0000 mg | ORAL_TABLET | Freq: Two times a day (BID) | ORAL | 1 refills | Status: AC

## 2024-10-14 MED ORDER — LOSARTAN POTASSIUM-HCTZ 100-25 MG PO TABS: 1.0000 | ORAL_TABLET | Freq: Every day | ORAL | 1 refills | Status: AC

## 2024-10-14 NOTE — Progress Notes (Signed)
 "  Established Patient Office Visit  Subjective   Patient ID: Joel Johnston, male    DOB: 07/25/1961  Age: 63 y.o. MRN: [id number]  Chief Complaint  Patient presents with   Medical Management of Chronic Issues    DM / HTN / HLD    HPI Discussed the use of AI scribe software for clinical note transcription with the patient, who gave verbal consent to proceed.  History of Present Illness He is a 63 year old male who presents for a six month follow-up.  Glycemic control - Prediabetes with most recent hemoglobin A1c of 5.7% - Continues metformin  therapy - Not checking sugars at home - denies any hypoglycemia symptoms  Hypertension management - Adherent to prescribed antihypertensive medication regimen  Erectile dysfunction - No current concerns or issues - Stable on Viagra  therapy - Requests medication refill  Sleep disturbance - Occasional insomnia managed with trazodone  as needed - No refill required at this time  Cardiopulmonary symptoms - No chest pain - No shortness of breath - No peripheral edema     ROS See HPI.    Objective:     BP 129/80 (BP Location: Right Arm, Patient Position: Sitting, Cuff Size: Normal)   Pulse 80   Ht 5' 7 (1.702 m)   Wt 181 lb (82.1 kg)   SpO2 97%   BMI 28.35 kg/m  BP Readings from Last 3 Encounters:  10/14/24 129/80  04/15/24 128/80  07/17/23 132/82   Wt Readings from Last 3 Encounters:  10/14/24 181 lb (82.1 kg)  04/15/24 179 lb (81.2 kg)  07/17/23 178 lb 8 oz (81 kg)      Physical Exam Constitutional:      Appearance: Normal appearance.  HENT:     Head: Normocephalic.  Neck:     Vascular: No carotid bruit.  Cardiovascular:     Rate and Rhythm: Normal rate and regular rhythm.  Pulmonary:     Effort: Pulmonary effort is normal.     Breath sounds: Normal breath sounds.  Musculoskeletal:     Right lower leg: No edema.     Left lower leg: No edema.  Neurological:     General: No focal deficit present.      Mental Status: He is alert and oriented to person, place, and time.  Psychiatric:        Mood and Affect: Mood normal.      Results for orders placed or performed in visit on 10/14/24  POCT HgB A1C  Result Value Ref Range   Hemoglobin A1C 5.7 (A) 4.0 - 5.6 %   HbA1c POC (<> result, manual entry)     HbA1c, POC (prediabetic range)     HbA1c, POC (controlled diabetic range)         Assessment & Plan:  SABRASABRASammy was seen today for medical management of chronic issues.  Diagnoses and all orders for this visit:  Essential hypertension, benign -     amLODipine  (NORVASC ) 10 MG tablet; Take 1 tablet (10 mg total) by mouth daily. -     losartan -hydrochlorothiazide (HYZAAR) 100-25 MG tablet; Take 1 tablet by mouth daily. -     CMP14+EGFR  Prediabetes -     POCT HgB A1C -     metFORMIN  (GLUCOPHAGE ) 500 MG tablet; Take 1 tablet (500 mg total) by mouth 2 (two) times daily with a meal. -     CMP14+EGFR  Primary insomnia  Dyslipidemia (high LDL; low HDL)  Erectile dysfunction, unspecified erectile dysfunction type -  sildenafil  (VIAGRA ) 100 MG tablet; Take 1 tablet (100 mg total) by mouth as needed for erectile dysfunction. TAKE 1/2 - 1 TABLET BY MOUTH DAILY AS NEEDED FOR ERECTILE DYSFUNCTION  Medication management -     CMP14+EGFR  Need for vaccination for Strep pneumoniae -     Pneumococcal conjugate vaccine 20-valent (Prevnar 20)  Immunization due -     Pneumococcal conjugate vaccine 20-valent (Prevnar 20)    Assessment & Plan Prediabetes A1c is 5.7, indicating prediabetes. Weight is stable. - Continue metformin . - Encouraged low sugar and carbohydrate intake.  Essential hypertension Blood pressure is well-controlled with current medication regimen. - Continue current blood pressure medication.  Erectile dysfunction Well-managed with current medication regimen. No concerns reported. - Refilled Viagra  prescription.  Primary insomnia Intermittent use of trazodone   for sleep. No need for refill. - Continue trazodone  as needed for sleep.  General Health Maintenance Due for pneumonia vaccination. Colonoscopy next due in 2030. - Administered pneumonia vaccination.    Return in about 6 months (around 04/14/2025).    Simi Briel, PA-C  "

## 2024-10-14 NOTE — Patient Instructions (Signed)
Stay on same medications

## 2024-10-15 LAB — CMP14+EGFR
ALT: 22 IU/L (ref 0–44)
AST: 21 IU/L (ref 0–40)
Albumin: 4.4 g/dL (ref 3.9–4.9)
Alkaline Phosphatase: 132 IU/L — ABNORMAL HIGH (ref 47–123)
BUN/Creatinine Ratio: 19 (ref 10–24)
BUN: 17 mg/dL (ref 8–27)
Bilirubin Total: 0.5 mg/dL (ref 0.0–1.2)
CO2: 21 mmol/L (ref 20–29)
Calcium: 9.2 mg/dL (ref 8.6–10.2)
Chloride: 101 mmol/L (ref 96–106)
Creatinine, Ser: 0.89 mg/dL (ref 0.76–1.27)
Globulin, Total: 2.4 g/dL (ref 1.5–4.5)
Glucose: 98 mg/dL (ref 70–99)
Potassium: 3.9 mmol/L (ref 3.5–5.2)
Sodium: 139 mmol/L (ref 134–144)
Total Protein: 6.8 g/dL (ref 6.0–8.5)
eGFR: 95 mL/min/1.73

## 2024-10-17 ENCOUNTER — Ambulatory Visit: Payer: Self-pay | Admitting: Physician Assistant

## 2024-10-17 NOTE — Progress Notes (Signed)
 Olson,   Stable labs.

## 2024-11-02 ENCOUNTER — Telehealth: Payer: Self-pay

## 2024-11-02 NOTE — Telephone Encounter (Signed)
 Spoke with the patient and advised him that the last call kim made was after his visit in December in regards to his labs. He stated it must be an old VM that he hadn't deleted

## 2024-11-02 NOTE — Telephone Encounter (Signed)
 Copied from CRM [id number]. Topic: General - Call Back - No Documentation >> Nov 02, 2024 11:47 AM Ivette P wrote: Reason for CRM: Pt called in stating he missed a call from nurse Luke, did not see any notes. Last note that Luke reached out and left a voicemail was 10/14/24 do not see recent notes.   Pls follow up with pt regarding missed call.

## 2025-04-19 ENCOUNTER — Ambulatory Visit: Admitting: Physician Assistant
